# Patient Record
Sex: Male | Born: 1980 | Race: White | Hispanic: No | Smoking: Never smoker
Health system: Southern US, Community
[De-identification: ages and names within clinical notes are randomized; demographics above are authoritative.]

## PROBLEM LIST (undated history)

## (undated) ENCOUNTER — Emergency Department (HOSPITAL_COMMUNITY): Admission: EM | Payer: BC Managed Care – PPO | Source: Home / Self Care

## (undated) DIAGNOSIS — J302 Other seasonal allergic rhinitis: Secondary | ICD-10-CM

## (undated) DIAGNOSIS — F329 Major depressive disorder, single episode, unspecified: Secondary | ICD-10-CM

## (undated) DIAGNOSIS — M5136 Other intervertebral disc degeneration, lumbar region: Secondary | ICD-10-CM

## (undated) DIAGNOSIS — M542 Cervicalgia: Secondary | ICD-10-CM

## (undated) DIAGNOSIS — M51369 Other intervertebral disc degeneration, lumbar region without mention of lumbar back pain or lower extremity pain: Secondary | ICD-10-CM

## (undated) DIAGNOSIS — F32A Depression, unspecified: Secondary | ICD-10-CM

## (undated) DIAGNOSIS — F419 Anxiety disorder, unspecified: Secondary | ICD-10-CM

## (undated) HISTORY — DX: Other intervertebral disc degeneration, lumbar region without mention of lumbar back pain or lower extremity pain: M51.369

## (undated) HISTORY — DX: Other intervertebral disc degeneration, lumbar region: M51.36

## (undated) HISTORY — DX: Other seasonal allergic rhinitis: J30.2

## (undated) HISTORY — DX: Cervicalgia: M54.2

## (undated) HISTORY — DX: Major depressive disorder, single episode, unspecified: F32.9

## (undated) HISTORY — DX: Depression, unspecified: F32.A

---
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
## 2004-04-18 ENCOUNTER — Emergency Department (HOSPITAL_COMMUNITY): Admission: EM | Admit: 2004-04-18 | Discharge: 2004-04-18 | Payer: Self-pay | Admitting: *Deleted

## 2005-01-15 ENCOUNTER — Emergency Department (HOSPITAL_COMMUNITY): Admission: EM | Admit: 2005-01-15 | Discharge: 2005-01-15 | Payer: Self-pay | Admitting: Family Medicine

## 2006-07-31 ENCOUNTER — Observation Stay (HOSPITAL_COMMUNITY): Admission: EM | Admit: 2006-07-31 | Discharge: 2006-08-01 | Payer: Self-pay | Admitting: Emergency Medicine

## 2007-03-30 ENCOUNTER — Encounter: Admission: RE | Admit: 2007-03-30 | Discharge: 2007-03-30 | Payer: Self-pay | Admitting: Family Medicine

## 2007-04-18 ENCOUNTER — Encounter: Admission: RE | Admit: 2007-04-18 | Discharge: 2007-04-18 | Payer: Self-pay | Admitting: Family Medicine

## 2007-08-11 ENCOUNTER — Encounter: Admission: RE | Admit: 2007-08-11 | Discharge: 2007-08-11 | Payer: Self-pay | Admitting: Family Medicine

## 2007-10-11 ENCOUNTER — Encounter: Admission: RE | Admit: 2007-10-11 | Discharge: 2007-10-11 | Payer: Self-pay | Admitting: Orthopedic Surgery

## 2010-09-15 ENCOUNTER — Ambulatory Visit (HOSPITAL_COMMUNITY)
Admission: RE | Admit: 2010-09-15 | Discharge: 2010-09-15 | Disposition: A | Discharge status: Home / Self Care | Payer: BC Managed Care – PPO | Source: Ambulatory Visit | Attending: Chiropractic Medicine | Admitting: Chiropractic Medicine

## 2010-09-15 ENCOUNTER — Other Ambulatory Visit (HOSPITAL_COMMUNITY): Payer: Self-pay | Admitting: Chiropractic Medicine

## 2010-09-15 DIAGNOSIS — R52 Pain, unspecified: Secondary | ICD-10-CM

## 2010-09-15 DIAGNOSIS — M25559 Pain in unspecified hip: Secondary | ICD-10-CM | POA: Insufficient documentation

## 2010-09-15 DIAGNOSIS — M549 Dorsalgia, unspecified: Secondary | ICD-10-CM | POA: Insufficient documentation

## 2010-09-28 ENCOUNTER — Inpatient Hospital Stay (HOSPITAL_COMMUNITY)
Admission: EM | Admit: 2010-09-28 | Discharge: 2010-10-04 | DRG: 533 | Disposition: A | Discharge status: Home / Self Care | Payer: BLUE CROSS/BLUE SHIELD | Attending: Surgery | Admitting: Surgery

## 2010-09-28 ENCOUNTER — Emergency Department (HOSPITAL_COMMUNITY): Payer: BLUE CROSS/BLUE SHIELD

## 2010-09-28 ENCOUNTER — Encounter (HOSPITAL_COMMUNITY): Payer: Self-pay | Admitting: Radiology

## 2010-09-28 DIAGNOSIS — F1010 Alcohol abuse, uncomplicated: Secondary | ICD-10-CM | POA: Diagnosis present

## 2010-09-28 DIAGNOSIS — S27329A Contusion of lung, unspecified, initial encounter: Secondary | ICD-10-CM | POA: Diagnosis present

## 2010-09-28 DIAGNOSIS — J90 Pleural effusion, not elsewhere classified: Secondary | ICD-10-CM | POA: Diagnosis not present

## 2010-09-28 DIAGNOSIS — S2249XA Multiple fractures of ribs, unspecified side, initial encounter for closed fracture: Secondary | ICD-10-CM | POA: Diagnosis present

## 2010-09-28 DIAGNOSIS — D62 Acute posthemorrhagic anemia: Secondary | ICD-10-CM | POA: Diagnosis present

## 2010-09-28 DIAGNOSIS — S060X9A Concussion with loss of consciousness of unspecified duration, initial encounter: Principal | ICD-10-CM | POA: Diagnosis present

## 2010-09-28 DIAGNOSIS — S22009A Unspecified fracture of unspecified thoracic vertebra, initial encounter for closed fracture: Secondary | ICD-10-CM | POA: Diagnosis present

## 2010-09-28 DIAGNOSIS — R51 Headache: Secondary | ICD-10-CM | POA: Diagnosis present

## 2010-09-28 DIAGNOSIS — S0100XA Unspecified open wound of scalp, initial encounter: Secondary | ICD-10-CM | POA: Diagnosis present

## 2010-09-28 DIAGNOSIS — Y998 Other external cause status: Secondary | ICD-10-CM

## 2010-09-28 DIAGNOSIS — Y92410 Unspecified street and highway as the place of occurrence of the external cause: Secondary | ICD-10-CM

## 2010-09-28 DIAGNOSIS — S271XXA Traumatic hemothorax, initial encounter: Secondary | ICD-10-CM | POA: Diagnosis present

## 2010-09-28 DIAGNOSIS — T797XXA Traumatic subcutaneous emphysema, initial encounter: Secondary | ICD-10-CM | POA: Diagnosis present

## 2010-09-28 LAB — POCT I-STAT, CHEM 8
Calcium, Ion: 1.03 mmol/L — ABNORMAL LOW (ref 1.12–1.32)
Glucose, Bld: 118 mg/dL — ABNORMAL HIGH (ref 70–99)
HCT: 42 % (ref 39.0–52.0)
Hemoglobin: 14.3 g/dL (ref 13.0–17.0)
TCO2: 20 mmol/L (ref 0–100)

## 2010-09-28 LAB — TYPE AND SCREEN
ABO/RH(D): A NEG
Antibody Screen: NEGATIVE
Unit division: 0

## 2010-09-28 LAB — COMPREHENSIVE METABOLIC PANEL
Alkaline Phosphatase: 30 U/L — ABNORMAL LOW (ref 39–117)
BUN: 12 mg/dL (ref 6–23)
Glucose, Bld: 116 mg/dL — ABNORMAL HIGH (ref 70–99)
Potassium: 3.7 mEq/L (ref 3.5–5.1)
Total Protein: 6.5 g/dL (ref 6.0–8.3)

## 2010-09-28 LAB — ETHANOL: Alcohol, Ethyl (B): 164 mg/dL — ABNORMAL HIGH (ref 0–10)

## 2010-09-28 LAB — CBC
HCT: 38.8 % — ABNORMAL LOW (ref 39.0–52.0)
MCHC: 34.5 g/dL (ref 30.0–36.0)
RDW: 12.3 % (ref 11.5–15.5)

## 2010-09-28 LAB — PROTIME-INR
INR: 0.98 (ref 0.00–1.49)
Prothrombin Time: 13.2 seconds (ref 11.6–15.2)

## 2010-09-28 LAB — ABO/RH: ABO/RH(D): A NEG

## 2010-09-28 MED ORDER — IOHEXOL 300 MG/ML  SOLN
100.0000 mL | Freq: Once | INTRAMUSCULAR | Status: AC | PRN
  Administered 2010-09-28: 100 mL via INTRAVENOUS

## 2010-09-29 ENCOUNTER — Inpatient Hospital Stay (HOSPITAL_COMMUNITY): Payer: BLUE CROSS/BLUE SHIELD

## 2010-09-29 LAB — CBC
MCH: 30.5 pg (ref 26.0–34.0)
MCHC: 33.8 g/dL (ref 30.0–36.0)
Platelets: 264 10*3/uL (ref 150–400)
RBC: 4.06 MIL/uL — ABNORMAL LOW (ref 4.22–5.81)
RDW: 12.5 % (ref 11.5–15.5)

## 2010-09-30 LAB — CBC
Platelets: 223 10*3/uL (ref 150–400)
RBC: 3.55 MIL/uL — ABNORMAL LOW (ref 4.22–5.81)
WBC: 8.9 10*3/uL (ref 4.0–10.5)

## 2010-09-30 LAB — DIFFERENTIAL
Basophils Absolute: 0 10*3/uL (ref 0.0–0.1)
Basophils Relative: 0 % (ref 0–1)
Eosinophils Absolute: 0 10*3/uL (ref 0.0–0.7)
Lymphs Abs: 2 10*3/uL (ref 0.7–4.0)
Neutrophils Relative %: 66 % (ref 43–77)

## 2010-10-02 ENCOUNTER — Inpatient Hospital Stay (HOSPITAL_COMMUNITY): Payer: BLUE CROSS/BLUE SHIELD

## 2010-10-02 LAB — BASIC METABOLIC PANEL
Chloride: 101 mEq/L (ref 96–112)
GFR calc Af Amer: >60 mL/min (ref >60)
GFR calc non Af Amer: >60 mL/min (ref >60)
Potassium: 3.9 mEq/L (ref 3.5–5.1)
Sodium: 135 mEq/L (ref 135–145)

## 2010-10-03 ENCOUNTER — Inpatient Hospital Stay (HOSPITAL_COMMUNITY): Payer: BLUE CROSS/BLUE SHIELD

## 2010-10-04 ENCOUNTER — Inpatient Hospital Stay (HOSPITAL_COMMUNITY): Payer: BLUE CROSS/BLUE SHIELD

## 2010-10-04 LAB — CBC
HCT: 33.1 % — ABNORMAL LOW (ref 39.0–52.0)
MCHC: 34.1 g/dL (ref 30.0–36.0)
Platelets: 325 10*3/uL (ref 150–400)
RDW: 12.2 % (ref 11.5–15.5)
WBC: 9.7 10*3/uL (ref 4.0–10.5)
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
## 2010-10-09 ENCOUNTER — Ambulatory Visit (HOSPITAL_COMMUNITY)
Admission: RE | Admit: 2010-10-09 | Discharge: 2010-10-09 | Disposition: A | Discharge status: Home / Self Care | Payer: BLUE CROSS/BLUE SHIELD | Source: Ambulatory Visit | Attending: Physician Assistant | Admitting: Physician Assistant

## 2010-10-09 ENCOUNTER — Other Ambulatory Visit (HOSPITAL_COMMUNITY): Payer: Self-pay | Admitting: Physician Assistant

## 2010-10-09 DIAGNOSIS — X58XXXA Exposure to other specified factors, initial encounter: Secondary | ICD-10-CM | POA: Insufficient documentation

## 2010-10-09 DIAGNOSIS — J942 Hemothorax: Secondary | ICD-10-CM

## 2010-10-09 DIAGNOSIS — S2249XA Multiple fractures of ribs, unspecified side, initial encounter for closed fracture: Secondary | ICD-10-CM | POA: Insufficient documentation

## 2010-10-09 DIAGNOSIS — R079 Chest pain, unspecified: Secondary | ICD-10-CM | POA: Insufficient documentation
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
## 2010-10-31 NOTE — H&P (Signed)
NAME:  Michael Powers, Michael Powers NO.:  [id number]   MEDICAL RECORD NO.:  [id number]          PATIENT TYPE:  EMS   LOCATION:  ED                           FACILITY:  [facility]   PHYSICIAN:  Lonia Blood, M.D.       DATE OF BIRTH:  [birth year]   DATE OF ADMISSION:  07/31/2006  DATE OF DISCHARGE:                              HISTORY & PHYSICAL   PRIMARY CARE PHYSICIAN:  This patient does not have a primary care  physician.   CHIEF COMPLAINT:  Headache.   HISTORY OF PRESENT ILLNESS:  Michael Powers is a 30 year old gentleman  without any significant past medical history who was taken to an urgent  care today by his Mom, after he had a night of fever, chills, and severe  headache.  The patient reports that he got abruptly sick overnight and  he did not have any symptoms to suggest the problems.  From the urgent  care, he was brought to the emergency room where he had an emergent  lumbar puncture and was told that he is going to be admitted for  meningitis.  Now I am evaluating the patient and he is basically lying  on the stretcher.  He is very slow to respond.  He complains bitterly of  a headache as well as severe neck pain.  He also reports that he has got  a queasy feeling in his abdomen.  He says that he has never been sick  and this is a very frightening experience to him.  The patient also  reports a nagging cough that has been persistent now ever since he got  sick.   PAST MEDICAL HISTORY:  1. Anxiety.  2. Sinus problems.   SOCIAL HISTORY:  The patient lives alone.  He has a high school  education.  He does smoke cigarettes.  He does drink alcohol.  He owns a  Actor.   HE HAS NO KNOWN DRUG ALLERGIES.   He does not take any medications.   FAMILY HISTORY:  Noncontributory.   REVIEW OF SYSTEMS:  As per HPI.  Negative for shortness of breath.  Negative for chest pain.  Negative for nausea, vomiting, or abdominal  pain.   PHYSICAL EXAMINATION:  VITAL SIGNS:   On admission show a temperature of  100.7, pulse 100, respirations 18, blood pressure 120/72.  GENERAL APPEARANCE:  A lethargic patient but alert.  He seems stuporous  and very slow to respond.  His responses are somehow appropriate but it  appears that the patient has to think a very long time to produce an  answer.  EYES:  Have pupils equal and round, reactive to light and accommodation.  Extraocular movements intact.  NECK:  There is no objective nuchal rigidity but there is a lot of  resistance upon the mobilization of the neck.  CHEST:  Clear to auscultation bilaterally without wheezing, rhonchi,  crackles.  HEART:  Regular rate and rhythm with no murmurs, rubs, or gallops.  ABDOMEN:  Soft, nontender.  Bowel sounds are present.  LOWER EXTREMITIES:  No edema.  SKIN:  Warm and dry without any suspicious rashes.  NEUROLOGIC:  Nonfocal.  Cranial nerves III-XII are intact.  Strength 5/5  in all four extremities.   LABORATORY VALUES:  At the time of admission, white blood cell count is  5.8, hemoglobin 13.3, platelet count 229.  Sodium 135, potassium 3.4,  chloride 105, bicarb 22, BUN 11, creatinine 0.9, glucose 170.  Urinalysis is clear.  Lumbar puncture was done and the glucose, protein,  and cell count are within normal limits.  Influenza nasal swab is  positive for influenza A and B.   ASSESSMENT/PLAN:  Severe influenza with the possibility of superimposed  bacterial infection given the patient's toxic status.  I will admit the  patient for observation, place him on Tamiflu, and empiric doxycycline.  Also, we will give the patient intravenous normal saline and monitor his  mentation status closely.      Lonia Blood, M.D.  Electronically Signed     SL/MEDQ  D:  07/31/2006  T:  07/31/2006  Job:  [id number]

## 2010-10-31 NOTE — Discharge Summary (Signed)
NAME:  Michael Powers, Michael Powers NO.:  [id number]   MEDICAL RECORD NO.:  [id number]          PATIENT TYPE:  OBV   LOCATION:  1616                         [facility]ACILITY:  [facility]   PHYSICIAN:  Lonia Blood, M.D.       DATE OF BIRTH:  1980/12/03   DATE OF ADMISSION:  07/31/2006  DATE OF DISCHARGE:  08/01/2006                               DISCHARGE SUMMARY   DISCHARGE DIAGNOSES:  1. Influenza with probable interstitial pneumonitis.  2. Meningismus with severe headache.  Lumbar puncture negative for      meningitis.  3. Anxiety.  4. Chronic sinus problems.   DISCHARGE MEDICATIONS:  1. Tamiflu 75 mg BID for 4 days.  2. Tylenol 500 mg TId for 1 week  3. Ibuprofen 400 mg TID for 1 week  4. Tussionex 5 cc po BID   CONDITION ON DISCHARGE:  Patient was discharged in good condition.  At  the time of discharge, his fever has subsided.  He was able to tolerate  a regular diet, and he was not having anymore headaches.  The patient  was instructed to follow up with his primary care physician at [facility] urgent care center.   PROCEDURES DURING THIS ADMISSION:  1. On July 31, 2006, the patient underwent computerized      tomographic scan of the head with findings of no evidence of acute      intracranial abnormality.  2. On July 31, 2006, lumbar puncture per the emergency room      physician.  3. On August 01, 2006, chest x-ray PA and lateral with findings of      peribronchial thickening without focal air space disease.   CONSULTATIONS:  No consultations were obtained.   HISTORY AND PHYSICAL:  For admission history and physical, refer to the  dictated H&P done by Dr. Lavera Guise on July 31, 2006.   HOSPITAL COURSE:  Influenza:  Mr. Bischoff was brought to the emergency  room by his family because of a prostrate state, inability to eat,  severe headaches, and high fever.  In the emergency room, he was  mistakenly diagnosed with meningitis, and he received a lumbar puncture.  Nasal swabs were positive for influenza A and B.  The patient was started promptly on antipyretics as well as Tamiflu.  He  had a course of rapid recovery.  His hospital course was complicated by  a mild post lumbar puncture headache, which responded well with  conservative treatment.  The patient was discharged in good condition to  follow up with his primary care physician.      Lonia Blood, M.D.  Electronically Signed     SL/MEDQ  D:  08/16/2006  T:  08/16/2006  Job:  [id number]

## 2011-07-12 ENCOUNTER — Emergency Department (HOSPITAL_COMMUNITY)
Admission: EM | Admit: 2011-07-12 | Discharge: 2011-07-12 | Disposition: A | Discharge status: Home / Self Care | Payer: BC Managed Care – PPO | Attending: Emergency Medicine | Admitting: Emergency Medicine

## 2011-07-12 ENCOUNTER — Encounter (HOSPITAL_COMMUNITY): Payer: Self-pay | Admitting: Emergency Medicine

## 2011-07-12 DIAGNOSIS — F419 Anxiety disorder, unspecified: Secondary | ICD-10-CM

## 2011-07-12 DIAGNOSIS — F411 Generalized anxiety disorder: Secondary | ICD-10-CM | POA: Insufficient documentation

## 2011-07-12 HISTORY — DX: Anxiety disorder, unspecified: F41.9

## 2011-07-12 NOTE — ED Notes (Signed)
Pt has had nausea.  Denies V/D.  Pt also c/o anxiety.  Pt has taken Clonazepam today with no relief of anxiety.
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
## 2011-07-12 NOTE — ED Notes (Signed)
Pt wanded by security. 

## 2011-07-12 NOTE — ED Notes (Addendum)
C/o anxiety that is not relieved with his normal medicine.  Pt states he had a large amount of etoh last night and that he possibly has a gluten allergy that is causing this.  Denies SI or HI.

## 2011-07-12 NOTE — ED Notes (Signed)
Pt st's he went to a concert last pm and had a lot of ETOH to drink.  Approx 20 beers and other alcohol.  Pt c/o nausea and st's he has anxiety and feels very anxious.  Pt denies SI or HI

## 2012-08-29 ENCOUNTER — Encounter: Payer: Self-pay | Admitting: *Deleted

## 2012-08-30 ENCOUNTER — Encounter: Payer: Self-pay | Admitting: Family Medicine

## 2012-08-30 ENCOUNTER — Ambulatory Visit (INDEPENDENT_AMBULATORY_CARE_PROVIDER_SITE_OTHER): Payer: BC Managed Care – PPO | Admitting: Family Medicine

## 2012-08-30 VITALS — BP 120/90 | HR 72 | Temp 97.6°F | Resp 16 | Ht 70.0 in | Wt 148.0 lb

## 2012-08-30 DIAGNOSIS — F411 Generalized anxiety disorder: Secondary | ICD-10-CM

## 2012-08-30 DIAGNOSIS — R197 Diarrhea, unspecified: Secondary | ICD-10-CM

## 2012-08-30 LAB — BASIC METABOLIC PANEL
BUN: 13 mg/dL (ref 6–23)
CO2: 27 mEq/L (ref 19–32)
Calcium: 9.4 mg/dL (ref 8.4–10.5)
Chloride: 104 mEq/L (ref 96–112)
Creat: 0.94 mg/dL (ref 0.50–1.35)
Glucose, Bld: 87 mg/dL (ref 70–99)
Potassium: 4.9 mEq/L (ref 3.5–5.3)
Sodium: 140 mEq/L (ref 135–145)

## 2012-08-30 LAB — HEPATIC FUNCTION PANEL
ALT: 21 U/L (ref 0–53)
AST: 22 U/L (ref 0–37)
Albumin: 4.3 g/dL (ref 3.5–5.2)
Alkaline Phosphatase: 37 U/L — ABNORMAL LOW (ref 39–117)
Bilirubin, Direct: 0.1 mg/dL (ref 0.0–0.3)
Indirect Bilirubin: 0.3 mg/dL (ref 0.0–0.9)
Total Bilirubin: 0.4 mg/dL (ref 0.3–1.2)
Total Protein: 6.5 g/dL (ref 6.0–8.3)

## 2012-08-30 NOTE — Patient Instructions (Addendum)
Diarrhea Infections caused by germs (bacterial) or a virus commonly cause diarrhea. Your caregiver has determined that with time, rest and fluids, the diarrhea should improve. In general, eat normally while drinking more water than usual. Although water may prevent dehydration, it does not contain salt and minerals (electrolytes). Broths, weak tea without caffeine and oral rehydration solutions (ORS) replace fluids and electrolytes. Small amounts of fluids should be taken frequently. Large amounts at one time may not be tolerated. Plain water may be harmful in infants and the elderly. Oral rehydrating solutions (ORS) are available at pharmacies and grocery stores. ORS replace water and important electrolytes in proper proportions. Sports drinks are not as effective as ORS and may be harmful due to sugars worsening diarrhea.  ORS is especially recommended for use in children with diarrhea. As a general guideline for children, replace any new fluid losses from diarrhea and/or vomiting with ORS as follows:  If your child weighs 22 pounds or under (10 kg or less), give 60-120 mL ( -  cup or 2 - 4 ounces) of ORS for each episode of diarrheal stool or vomiting episode.  If your child weighs more than 22 pounds (more than 10 kgs), give 120-240 mL ( - 1 cup or 4 - 8 ounces) of ORS for each diarrheal stool or episode of vomiting.  While correcting for dehydration, children should eat normally. However, foods high in sugar should be avoided because this may worsen diarrhea. Large amounts of carbonated soft drinks, juice, gelatin desserts and other highly sugared drinks should be avoided.  After correction of dehydration, other liquids that are appealing to the child may be added. Children should drink small amounts of fluids frequently and fluids should be increased as tolerated. Children should drink enough fluids to keep urine clear or pale yellow.  Adults should eat normally while drinking more fluids than  usual. Drink small amounts of fluids frequently and increase as tolerated. Drink enough fluids to keep urine clear or pale yellow. Broths, weak decaffeinated tea, lemon lime soft drinks (allowed to go flat) and ORS replace fluids and electrolytes.  Avoid:  Carbonated drinks.  Juice.  Extremely hot or cold fluids.  Caffeine drinks.  Fatty, greasy foods.  Alcohol.  Tobacco.  Too much intake of anything at one time.  Gelatin desserts.  Probiotics are active cultures of beneficial bacteria. They may lessen the amount and number of diarrheal stools in adults. Probiotics can be found in yogurt with active cultures and in supplements.  Wash hands well to avoid spreading bacteria and virus.  Anti-diarrheal medications are not recommended for infants and children.  Only take over-the-counter or prescription medicines for pain, discomfort or fever as directed by your caregiver. Do not give aspirin to children because it may cause Reye's Syndrome.  For adults, ask your caregiver if you should continue all prescribed and over-the-counter medicines.  If your caregiver has given you a follow-up appointment, it is very important to keep that appointment. Not keeping the appointment could result in a chronic or permanent injury, and disability. If there is any problem keeping the appointment, you must call back to this facility for assistance. SEEK IMMEDIATE MEDICAL CARE IF:   You or your child is unable to keep fluids down or other symptoms or problems become worse in spite of treatment.  Vomiting or diarrhea develops and becomes persistent.  There is vomiting of blood or bile (green material).  There is blood in the stool or the stools are black and   tarry.  There is no urine output in 6-8 hours or there is only a small amount of very dark urine.  Abdominal pain develops, increases or localizes.  You have a fever.  Your baby is older than 3 months with a rectal temperature of 102 F  (38.9 C) or higher.  Your baby is 3 months old or younger with a rectal temperature of 100.4 F (38 C) or higher.  You or your child develops excessive weakness, dizziness, fainting or extreme thirst.  You or your child develops a rash, stiff neck, severe headache or become irritable or sleepy and difficult to awaken. MAKE SURE YOU:   Understand these instructions.  Will watch your condition.  Will get help right away if you are not doing well or get worse. Document Released: 05/22/2002 Document Revised: 08/24/2011 Document Reviewed: 04/08/2009 [employer] Patient Information 2013 [employer].  

## 2012-08-30 NOTE — Progress Notes (Signed)
Subjective:     Patient ID: ROC STREETT, male   DOB: 07-Nov-1980, 32 y.o.   MRN: [id number]  HPI Symptoms began in February. reports sudden onset of watery diarrhea.  Symptoms persisted for 2 days. Patient went to urgent care. There he was placed on Cipro and Lomotil. After 4 days the diarrhea improved. He was fine until March 13. Again sudden onset of watery diarrhea began. He is now having 4 days of low-grade fevers, watery diarrhea, and crampy abdominal pain. He denies any recent travel, sick contacts, no hospitalizations. He denies high-grade fever or hematochezia.  Review of Systems  Constitutional: Positive for fever and fatigue.  HENT: Negative for congestion, rhinorrhea and postnasal drip.   Respiratory: Negative for cough.   Gastrointestinal: Negative for abdominal pain, blood in stool, abdominal distention and anal bleeding.       Objective:   Physical Exam  Constitutional: He appears well-developed and well-nourished. No distress.  HENT:  Head: Normocephalic.  Right Ear: External ear normal.  Left Ear: External ear normal.  Mouth/Throat: Oropharynx is clear and moist.  Eyes: Conjunctivae are normal. Pupils are equal, round, and reactive to light.  Neck: Normal range of motion. Neck supple.  Cardiovascular: Normal rate, regular rhythm and normal heart sounds.   Pulmonary/Chest: Effort normal and breath sounds normal.  Abdominal: Soft. Bowel sounds are normal. He exhibits no distension and no mass. There is no tenderness. There is no rebound and no guarding.  Lymphadenopathy:    He has no cervical adenopathy.  Skin: He is not diaphoretic.       Assessment:    diarrhea Generalized anxiety disorder    Plan:    I. suspect viral gastroenteritis I think this is likely a second infection that was just a coincidence I have advised the patient to use Imodium as directed on the bottle. I have also advised him to push fluids such as Gatorade. I will check stool cultures and stool  O&P. Will also check a CBC and CMP

## 2012-08-30 NOTE — Addendum Note (Signed)
Addended by: WRAY, [place] on: 08/30/2012 12:09 PM   Modules accepted: Orders

## 2012-08-31 ENCOUNTER — Other Ambulatory Visit (INDEPENDENT_AMBULATORY_CARE_PROVIDER_SITE_OTHER): Payer: BC Managed Care – PPO

## 2012-08-31 DIAGNOSIS — R197 Diarrhea, unspecified: Secondary | ICD-10-CM

## 2012-08-31 LAB — CBC WITH DIFFERENTIAL/PLATELET
Basophils Absolute: 0 10*3/uL (ref 0.0–0.1)
Basophils Relative: 1 % (ref 0–1)
Eosinophils Absolute: 0.1 10*3/uL (ref 0.0–0.7)
Eosinophils Relative: 3 % (ref 0–5)
HCT: 43.9 % (ref 39.0–52.0)
Hemoglobin: 14.9 g/dL (ref 13.0–17.0)
Lymphocytes Relative: 45 % (ref 12–46)
Lymphs Abs: 2 10*3/uL (ref 0.7–4.0)
MCH: 29.6 pg (ref 26.0–34.0)
MCHC: 33.9 g/dL (ref 30.0–36.0)
MCV: 87.3 fL (ref 78.0–100.0)
Monocytes Absolute: 0.8 10*3/uL (ref 0.1–1.0)
Monocytes Relative: 18 % — ABNORMAL HIGH (ref 3–12)
Neutro Abs: 1.5 10*3/uL — ABNORMAL LOW (ref 1.7–7.7)
Neutrophils Relative %: 33 % — ABNORMAL LOW (ref 43–77)
Platelets: 256 10*3/uL (ref 150–400)
RBC: 5.03 MIL/uL (ref 4.22–5.81)
RDW: 13.7 % (ref 11.5–15.5)
WBC: 4.4 10*3/uL (ref 4.0–10.5)

## 2012-08-31 NOTE — Addendum Note (Signed)
Addended by: WRAY, [place] on: 08/31/2012 08:56 AM   Modules accepted: Orders

## 2012-08-31 NOTE — Addendum Note (Signed)
Addended by: WRAY, [place] on: 08/31/2012 12:21 PM   Modules accepted: Orders

## 2012-08-31 NOTE — Addendum Note (Signed)
Addended by: WRAY, [place] on: 08/31/2012 12:14 PM   Modules accepted: Orders

## 2012-09-01 LAB — CLOSTRIDIUM DIFFICILE EIA: CDIFTX: NEGATIVE

## 2012-09-02 LAB — OVA AND PARASITE EXAMINATION: OP: NONE SEEN

## 2012-09-04 LAB — STOOL CULTURE

## 2013-01-13 ENCOUNTER — Telehealth: Payer: Self-pay | Admitting: Family Medicine

## 2013-01-13 NOTE — Telephone Encounter (Signed)
Ok to refill 

## 2013-01-13 NOTE — Telephone Encounter (Signed)
?   OK to Refill  

## 2013-01-16 MED ORDER — CLONAZEPAM 0.5 MG PO TABS: 0.1250 mg | ORAL_TABLET | Freq: Two times a day (BID) | ORAL | Status: DC | PRN

## 2013-01-16 NOTE — Telephone Encounter (Signed)
Rx Refilled  

## 2013-01-22 ENCOUNTER — Emergency Department (HOSPITAL_COMMUNITY)
Admission: EM | Admit: 2013-01-22 | Discharge: 2013-01-22 | Disposition: A | Discharge status: Home / Self Care | Payer: BC Managed Care – PPO | Attending: Emergency Medicine | Admitting: Emergency Medicine

## 2013-01-22 ENCOUNTER — Emergency Department (HOSPITAL_COMMUNITY): Payer: BC Managed Care – PPO

## 2013-01-22 ENCOUNTER — Encounter (HOSPITAL_COMMUNITY): Payer: Self-pay | Admitting: *Deleted

## 2013-01-22 DIAGNOSIS — R109 Unspecified abdominal pain: Secondary | ICD-10-CM

## 2013-01-22 DIAGNOSIS — R110 Nausea: Secondary | ICD-10-CM | POA: Insufficient documentation

## 2013-01-22 DIAGNOSIS — R1011 Right upper quadrant pain: Secondary | ICD-10-CM | POA: Insufficient documentation

## 2013-01-22 DIAGNOSIS — Z8739 Personal history of other diseases of the musculoskeletal system and connective tissue: Secondary | ICD-10-CM | POA: Insufficient documentation

## 2013-01-22 DIAGNOSIS — K219 Gastro-esophageal reflux disease without esophagitis: Secondary | ICD-10-CM | POA: Insufficient documentation

## 2013-01-22 DIAGNOSIS — F329 Major depressive disorder, single episode, unspecified: Secondary | ICD-10-CM | POA: Insufficient documentation

## 2013-01-22 DIAGNOSIS — Z8709 Personal history of other diseases of the respiratory system: Secondary | ICD-10-CM | POA: Insufficient documentation

## 2013-01-22 DIAGNOSIS — F3289 Other specified depressive episodes: Secondary | ICD-10-CM | POA: Insufficient documentation

## 2013-01-22 DIAGNOSIS — R079 Chest pain, unspecified: Secondary | ICD-10-CM | POA: Insufficient documentation

## 2013-01-22 DIAGNOSIS — F410 Panic disorder [episodic paroxysmal anxiety] without agoraphobia: Secondary | ICD-10-CM | POA: Insufficient documentation

## 2013-01-22 LAB — CBC WITH DIFFERENTIAL/PLATELET
Basophils Absolute: 0 10*3/uL (ref 0.0–0.1)
Basophils Relative: 1 % (ref 0–1)
HCT: 42.6 % (ref 39.0–52.0)
Hemoglobin: 14.9 g/dL (ref 13.0–17.0)
Lymphocytes Relative: 23 % (ref 12–46)
MCHC: 35 g/dL (ref 30.0–36.0)
Neutro Abs: 5.6 10*3/uL (ref 1.7–7.7)
Neutrophils Relative %: 68 % (ref 43–77)
RDW: 12 % (ref 11.5–15.5)
WBC: 8.3 10*3/uL (ref 4.0–10.5)

## 2013-01-22 LAB — COMPREHENSIVE METABOLIC PANEL
Albumin: 3.8 g/dL (ref 3.5–5.2)
Alkaline Phosphatase: 47 U/L (ref 39–117)
BUN: 11 mg/dL (ref 6–23)
Creatinine, Ser: 0.88 mg/dL (ref 0.50–1.35)
GFR calc Af Amer: >90 mL/min (ref >90)
Glucose, Bld: 97 mg/dL (ref 70–99)
Potassium: 3.7 mEq/L (ref 3.5–5.1)
Total Bilirubin: 0.7 mg/dL (ref 0.3–1.2)
Total Protein: 6.8 g/dL (ref 6.0–8.3)

## 2013-01-22 LAB — LIPASE, BLOOD: Lipase: 22 U/L (ref 11–59)

## 2013-01-22 LAB — POCT I-STAT TROPONIN I: Troponin i, poc: 0 ng/mL (ref 0.00–0.08)

## 2013-01-22 MED ORDER — GI COCKTAIL ~~LOC~~
30.0000 mL | Freq: Once | ORAL | Status: AC
  Administered 2013-01-22: 30 mL via ORAL
  Filled 2013-01-22: qty 30

## 2013-01-22 MED ORDER — OMEPRAZOLE 20 MG PO CPDR: 40.0000 mg | DELAYED_RELEASE_CAPSULE | Freq: Every day | ORAL | Status: DC
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
## 2013-01-22 NOTE — ED Notes (Signed)
Admits to drinking a lot of alcohol the past 4 days and tequila.
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
## 2013-01-25 NOTE — ED Provider Notes (Signed)
Medical screening examination/treatment/procedure(s) were conducted as a shared visit with non-physician practitioner(s) and myself.  I personally evaluated the patient during the encounter.  Patient comes in with AMS. Pt is altered, but baseline per family when he relapses. CT heard is normal. No concerns for infection.  Toxic/alcoholis encephalopathy. No seizures appreciated. Will help out with dispo to detox.  Derwood Kaplan, MD 01/25/13 1227

## 2013-02-27 ENCOUNTER — Ambulatory Visit (INDEPENDENT_AMBULATORY_CARE_PROVIDER_SITE_OTHER): Payer: BC Managed Care – PPO | Admitting: Physician Assistant

## 2013-02-27 ENCOUNTER — Encounter: Payer: Self-pay | Admitting: Physician Assistant

## 2013-02-27 VITALS — BP 110/74 | HR 68 | Temp 98.1°F | Resp 18 | Ht 69.0 in | Wt 145.0 lb

## 2013-02-27 DIAGNOSIS — M79609 Pain in unspecified limb: Secondary | ICD-10-CM

## 2013-02-27 DIAGNOSIS — M79631 Pain in right forearm: Secondary | ICD-10-CM

## 2013-02-27 MED ORDER — CYCLOBENZAPRINE HCL 10 MG PO TABS: 10.0000 mg | ORAL_TABLET | Freq: Three times a day (TID) | ORAL | Status: DC | PRN

## 2013-02-27 MED ORDER — PREDNISONE 20 MG PO TABS: ORAL_TABLET | ORAL | Status: DC

## 2013-02-27 NOTE — Progress Notes (Signed)
Patient ID: Michael Powers MRN: [id number], DOB: [birth year], 32 y.o. Date of Encounter: 02/27/2013, 4:57 PM    Chief Complaint:  Chief Complaint  Patient presents with  . c/o right forearm pain    no known injury, tender spot on elbow     HPI: 32 y.o. year old white male present as for evaluation of pain in his right forearm. He says about one week ago he noticed that there was one area on the right elbow that if he applied pressure to that one spot to a positive shooting pain down the arm. For the past 3 days he has been noticing pain in the forearm itself. There was no acute onset of pain. He said he noticed it over the weekend when he was picking up his daughter as well as with other activities. He had no acute injury or trauma. He works doing Aeronautical engineer. Says that he is extremely busy last week doing aerating, seeding, putting out fertilizer, seeds, and straw.  Home Meds: See attached medication section for any medications that were entered at today's visit. The computer does not put those onto this list.The following list is a list of meds entered prior to today's visit.   Current Outpatient Prescriptions on File Prior to Visit  Medication Sig Dispense Refill  . clonazePAM (KLONOPIN) 0.5 MG tablet Take 0.25 mg by mouth 2 (two) times daily as needed for anxiety.      Marland Kitchen omeprazole (PRILOSEC) 20 MG capsule Take 2 capsules (40 mg total) by mouth daily.  30 capsule  0   No current facility-administered medications on file prior to visit.    Allergies:  Allergies  Allergen Reactions  . Bee Venom       Review of Systems: See HPI for pertinent ROS. All other ROS negative.    Physical Exam: Blood pressure 110/74, pulse 68, temperature 98.1 F (36.7 C), temperature source Oral, resp. rate 18, height 5\' 9"  (1.753 m), weight 145 lb (65.772 kg)., Body mass index is 21.4 kg/(m^2). General: Well-nourished well-developed white male. Appears in no acute distress. Lungs: Clear bilaterally  to auscultation without wheezes, rales, or rhonchi. Breathing is unlabored. Heart: Regular rhythm. No murmurs, rubs, or gallops. Msk:  Strength and tone normal for age. Right arm: Olecranon process is palpated. There is moderate tenderness with palpation here. There is no sign of olecranon bursitis. No erythema no swelling no protrusion. There is no pain with palpation of the medial epicondyles. There is mild tenderness with palpation of the lateral epicondyle. No severe tenderness with palpation of the muscle in the area just distal to the level of the elbow on the dorsal side. This muscle protrudes on both of his arms bilaterally that isn't protruding more so on the right than the left. This muscle is tender from just distal of the elbow all the way down to the level of the wrist. There is full range of motion of the wrist with minimal pain. 5 over 5 grip strength. Neuro: Alert and oriented X 3. Moves all extremities spontaneously. Gait is normal. CNII-XII grossly in tact. Psych:  Responds to questions appropriately with a normal affect.     ASSESSMENT AND PLAN:  32 y.o. year old male with  1. Right forearm pain Patient says that he is really concerned about his elbow and really would like an x-ray. We'll go ahead and get x-ray to rule out any type of mass.   The most of his symptoms are secondary to muscle strain  and inflammation around the nerve. Cautioned regarding possible drowsiness with Flexeril. This is the case just use this at night. If symptoms are not significantly improved or resolved by the completion of the prednisone then followup. He is to rest the arm as much as possible. Discussed that if he continues to do repetitive motion or any lifting or other movement against resistance that this is going to prolong the irritation and inflammation there. - DG Elbow Complete Right; Future - predniSONE (DELTASONE) 20 MG tablet; Take 3 daily for 2 days, then 2 daily for 2 days, then 1 daily  for 2 days.  Dispense: 12 tablet; Refill: 0 - cyclobenzaprine (FLEXERIL) 10 MG tablet; Take 1 tablet (10 mg total) by mouth 3 (three) times daily as needed for muscle spasms.  Dispense: 30 tablet; Refill: 0   Signed, [address] [place], [place], [facility] 02/27/2013 4:57 PM

## 2013-03-30 ENCOUNTER — Ambulatory Visit
Admission: RE | Admit: 2013-03-30 | Discharge: 2013-03-30 | Disposition: A | Discharge status: Home / Self Care | Payer: BC Managed Care – PPO | Source: Ambulatory Visit | Attending: Physician Assistant | Admitting: Physician Assistant

## 2013-03-30 DIAGNOSIS — M79631 Pain in right forearm: Secondary | ICD-10-CM

## 2013-10-02 ENCOUNTER — Encounter: Payer: Self-pay | Admitting: Family Medicine

## 2013-10-02 ENCOUNTER — Ambulatory Visit (INDEPENDENT_AMBULATORY_CARE_PROVIDER_SITE_OTHER): Payer: BC Managed Care – PPO | Admitting: Family Medicine

## 2013-10-02 VITALS — BP 110/60 | HR 60 | Temp 97.8°F | Resp 12 | Ht 70.5 in | Wt 144.0 lb

## 2013-10-02 DIAGNOSIS — J019 Acute sinusitis, unspecified: Secondary | ICD-10-CM

## 2013-10-02 MED ORDER — PREDNISONE 20 MG PO TABS: ORAL_TABLET | ORAL | Status: DC

## 2013-10-02 MED ORDER — FLUTICASONE PROPIONATE 50 MCG/ACT NA SUSP: 2.0000 | Freq: Every day | NASAL | Status: DC

## 2013-10-02 MED ORDER — CEFDINIR 300 MG PO CAPS: 300.0000 mg | ORAL_CAPSULE | Freq: Two times a day (BID) | ORAL | Status: DC
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
## 2014-04-04 ENCOUNTER — Emergency Department (HOSPITAL_COMMUNITY): Payer: BC Managed Care – PPO

## 2014-04-04 ENCOUNTER — Encounter (HOSPITAL_COMMUNITY): Payer: Self-pay | Admitting: Emergency Medicine

## 2014-04-04 ENCOUNTER — Emergency Department (HOSPITAL_COMMUNITY)
Admission: EM | Admit: 2014-04-04 | Discharge: 2014-04-04 | Disposition: A | Discharge status: Home / Self Care | Payer: BC Managed Care – PPO | Attending: Emergency Medicine | Admitting: Emergency Medicine

## 2014-04-04 DIAGNOSIS — Y990 Civilian activity done for income or pay: Secondary | ICD-10-CM | POA: Diagnosis not present

## 2014-04-04 DIAGNOSIS — Y9269 Other specified industrial and construction area as the place of occurrence of the external cause: Secondary | ICD-10-CM | POA: Insufficient documentation

## 2014-04-04 DIAGNOSIS — S0101XA Laceration without foreign body of scalp, initial encounter: Secondary | ICD-10-CM | POA: Insufficient documentation

## 2014-04-04 DIAGNOSIS — S060X1A Concussion with loss of consciousness of 30 minutes or less, initial encounter: Secondary | ICD-10-CM | POA: Diagnosis not present

## 2014-04-04 DIAGNOSIS — Z7951 Long term (current) use of inhaled steroids: Secondary | ICD-10-CM | POA: Diagnosis not present

## 2014-04-04 DIAGNOSIS — Z79899 Other long term (current) drug therapy: Secondary | ICD-10-CM | POA: Diagnosis not present

## 2014-04-04 DIAGNOSIS — Y93H2 Activity, gardening and landscaping: Secondary | ICD-10-CM | POA: Insufficient documentation

## 2014-04-04 DIAGNOSIS — Z23 Encounter for immunization: Secondary | ICD-10-CM | POA: Diagnosis not present

## 2014-04-04 DIAGNOSIS — W01198A Fall on same level from slipping, tripping and stumbling with subsequent striking against other object, initial encounter: Secondary | ICD-10-CM | POA: Diagnosis not present

## 2014-04-04 DIAGNOSIS — Z8739 Personal history of other diseases of the musculoskeletal system and connective tissue: Secondary | ICD-10-CM | POA: Insufficient documentation

## 2014-04-04 DIAGNOSIS — S161XXA Strain of muscle, fascia and tendon at neck level, initial encounter: Secondary | ICD-10-CM | POA: Diagnosis not present

## 2014-04-04 DIAGNOSIS — S0990XA Unspecified injury of head, initial encounter: Secondary | ICD-10-CM | POA: Diagnosis present

## 2014-04-04 DIAGNOSIS — Z8659 Personal history of other mental and behavioral disorders: Secondary | ICD-10-CM | POA: Insufficient documentation

## 2014-04-04 LAB — COMPREHENSIVE METABOLIC PANEL
ALBUMIN: 4.2 g/dL (ref 3.5–5.2)
ALT: 14 U/L (ref 0–53)
ANION GAP: 14 (ref 5–15)
AST: 18 U/L (ref 0–37)
Alkaline Phosphatase: 39 U/L (ref 39–117)
BILIRUBIN TOTAL: 0.6 mg/dL (ref 0.3–1.2)
BUN: 18 mg/dL (ref 6–23)
CHLORIDE: 104 meq/L (ref 96–112)
CO2: 24 mEq/L (ref 19–32)
CREATININE: 1.02 mg/dL (ref 0.50–1.35)
Calcium: 9.6 mg/dL (ref 8.4–10.5)
GFR calc non Af Amer: >90 mL/min (ref >90)
GLUCOSE: 111 mg/dL — AB (ref 70–99)
Potassium: 3.8 mEq/L (ref 3.7–5.3)
Sodium: 142 mEq/L (ref 137–147)
TOTAL PROTEIN: 6.9 g/dL (ref 6.0–8.3)

## 2014-04-04 LAB — PREPARE FRESH FROZEN PLASMA
UNIT DIVISION: 0
Unit division: 0

## 2014-04-04 LAB — BLOOD PRODUCT ORDER (VERBAL) VERIFICATION

## 2014-04-04 LAB — CBC
HEMATOCRIT: 42.3 % (ref 39.0–52.0)
Hemoglobin: 14.4 g/dL (ref 13.0–17.0)
MCH: 30.6 pg (ref 26.0–34.0)
MCHC: 34 g/dL (ref 30.0–36.0)
MCV: 89.8 fL (ref 78.0–100.0)
Platelets: 269 10*3/uL (ref 150–400)
RBC: 4.71 MIL/uL (ref 4.22–5.81)
RDW: 12.1 % (ref 11.5–15.5)
WBC: 7.8 10*3/uL (ref 4.0–10.5)

## 2014-04-04 LAB — TYPE AND SCREEN
ABO/RH(D): A NEG
Antibody Screen: NEGATIVE
UNIT DIVISION: 0
Unit division: 0

## 2014-04-04 LAB — PROTIME-INR
INR: 1.01 (ref 0.00–1.49)
PROTHROMBIN TIME: 13.4 s (ref 11.6–15.2)

## 2014-04-04 LAB — ETHANOL

## 2014-04-04 LAB — CDS SEROLOGY

## 2014-04-04 LAB — ABO/RH: ABO/RH(D): A NEG

## 2014-04-04 MED ORDER — SODIUM CHLORIDE 0.9 % IV SOLN
Freq: Once | INTRAVENOUS | Status: AC
  Administered 2014-04-04: 14:00:00 via INTRAVENOUS

## 2014-04-04 MED ORDER — OXYCODONE-ACETAMINOPHEN 5-325 MG PO TABS
1.0000 | ORAL_TABLET | Freq: Once | ORAL | Status: AC
  Administered 2014-04-04: 1 via ORAL
  Filled 2014-04-04: qty 1

## 2014-04-04 MED ORDER — LIDOCAINE HCL (PF) 1 % IJ SOLN
5.0000 mL | Freq: Once | INTRAMUSCULAR | Status: AC
  Administered 2014-04-04: 5 mL via INTRADERMAL
  Filled 2014-04-04: qty 5

## 2014-04-04 MED ORDER — TETANUS-DIPHTH-ACELL PERTUSSIS 5-2.5-18.5 LF-MCG/0.5 IM SUSP
0.5000 mL | Freq: Once | INTRAMUSCULAR | Status: AC
  Administered 2014-04-04: 0.5 mL via INTRAMUSCULAR
  Filled 2014-04-04: qty 0.5

## 2014-04-04 MED ORDER — SODIUM CHLORIDE 0.9 % IV SOLN
Freq: Once | INTRAVENOUS | Status: AC
  Administered 2014-04-04: 12:00:00 via INTRAVENOUS

## 2014-04-04 NOTE — ED Notes (Signed)
Pt sitting on side of stretcher and states he is feeling light headed again. MD aware and asked to feed pt and given IVF

## 2014-04-04 NOTE — ED Notes (Signed)
When pt sitting on side of stretcher, states he feels really dizzy.
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
## 2014-04-04 NOTE — ED Notes (Signed)
Family at beside. Family given emotional support. 

## 2014-04-04 NOTE — ED Notes (Signed)
PA at the bedside to staple laceration on head.
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
## 2014-04-04 NOTE — ED Notes (Signed)
Walked patient felt dizzy when he stood up but, patient walked fine

## 2014-04-04 NOTE — ED Provider Notes (Signed)
Medical screening examination/treatment/procedure(s) were conducted as a shared visit with non-physician practitioner(s) and myself.  I personally evaluated the patient during the encounter.   EKG Interpretation None         Joya Gaskinsonald W Makel Mcmann, MD 04/04/14 1352

## 2014-04-04 NOTE — ED Notes (Signed)
Pt got up and walked in room with two RNs at the bedside. Pt states he feels much better since he was able to eat something. Dr. Bebe ShaggyWickline made aware.

## 2014-04-04 NOTE — ED Notes (Signed)
Initial pressure in triage was 79 systolic. Pt arrived via POV
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
## 2014-04-04 NOTE — ED Notes (Signed)
Pt taken to CT scan.

## 2014-04-04 NOTE — ED Provider Notes (Signed)
12:32 PM Asked by Dr. Bebe ShaggyWickline to repair head laceration.  LACERATION REPAIR Performed by: Roxy HorsemanBROWNING, Kyliana Standen Authorized by: Roxy HorsemanBROWNING, Kerem Gilmer Consent: Verbal consent obtained. Risks and benefits: risks, benefits and alternatives were discussed Consent given by: patient Patient identity confirmed: provided demographic data Prepped and Draped in normal sterile fashion Wound explored  Laceration Location: top of head  Laceration Length: 3 cm  No Foreign Bodies seen or palpated  Anesthesia: local infiltration  Local anesthetic: lidocaine 1% without epinephrine  Anesthetic total: 3 ml  Irrigation method: syringe Amount of cleaning: standard  Skin closure: staples  Number of sutures: 3  Technique: staples  Patient tolerance: Patient tolerated the procedure well with no immediate complications.   Roxy Horsemanobert Avagail Whittlesey, PA-C 04/04/14 1306

## 2014-04-04 NOTE — ED Notes (Signed)
Dr. Bebe Shaggywickline back at the bedside to remove c-collar.

## 2014-04-06 ENCOUNTER — Telehealth: Payer: Self-pay | Admitting: *Deleted

## 2014-04-06 NOTE — Telephone Encounter (Signed)
lmtrc

## 2014-04-06 NOTE — Telephone Encounter (Signed)
Call patient and have him come in next week to discuss.
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
## 2014-04-09 NOTE — Telephone Encounter (Signed)
Pt aware need to come in this week to discuss concusson, appt made for Thurs Oct 29 at 12:15

## 2014-04-12 ENCOUNTER — Ambulatory Visit (INDEPENDENT_AMBULATORY_CARE_PROVIDER_SITE_OTHER): Payer: BC Managed Care – PPO | Admitting: Family Medicine

## 2014-04-12 ENCOUNTER — Encounter: Payer: Self-pay | Admitting: Family Medicine

## 2014-04-12 VITALS — BP 100/76 | HR 82 | Temp 98.2°F | Resp 12 | Ht 70.5 in | Wt 151.0 lb

## 2014-04-12 DIAGNOSIS — S060X1A Concussion with loss of consciousness of 30 minutes or less, initial encounter: Secondary | ICD-10-CM
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
## 2014-04-17 ENCOUNTER — Ambulatory Visit (INDEPENDENT_AMBULATORY_CARE_PROVIDER_SITE_OTHER): Payer: BC Managed Care – PPO | Admitting: Diagnostic Neuroimaging

## 2014-04-17 ENCOUNTER — Encounter: Payer: Self-pay | Admitting: Diagnostic Neuroimaging

## 2014-04-17 VITALS — Ht 70.0 in | Wt 149.4 lb

## 2014-04-17 DIAGNOSIS — F0781 Postconcussional syndrome: Secondary | ICD-10-CM

## 2014-04-17 DIAGNOSIS — F411 Generalized anxiety disorder: Secondary | ICD-10-CM

## 2014-04-17 DIAGNOSIS — G4700 Insomnia, unspecified: Secondary | ICD-10-CM

## 2014-04-17 MED ORDER — AMITRIPTYLINE HCL 25 MG PO TABS: 25.0000 mg | ORAL_TABLET | Freq: Every day | ORAL | Status: DC

## 2014-04-17 NOTE — Patient Instructions (Signed)
Try amitriptyline 25mg  at bedtime to help with sleep, anxiety and headaches.  Rest and gradually increase activities after you are feeling better.
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
## 2014-04-19 ENCOUNTER — Ambulatory Visit (INDEPENDENT_AMBULATORY_CARE_PROVIDER_SITE_OTHER): Payer: BC Managed Care – PPO | Admitting: Family Medicine

## 2014-04-19 ENCOUNTER — Telehealth: Payer: Self-pay | Admitting: Family Medicine

## 2014-04-19 ENCOUNTER — Encounter: Payer: Self-pay | Admitting: Family Medicine

## 2014-04-19 VITALS — BP 110/68 | HR 78 | Temp 97.8°F | Resp 16 | Ht 70.5 in | Wt 151.0 lb

## 2014-04-19 DIAGNOSIS — IMO0001 Reserved for inherently not codable concepts without codable children: Secondary | ICD-10-CM

## 2014-04-19 DIAGNOSIS — H8143 Vertigo of central origin, bilateral: Secondary | ICD-10-CM

## 2014-04-19 MED ORDER — MECLIZINE HCL 25 MG PO TABS: 25.0000 mg | ORAL_TABLET | Freq: Three times a day (TID) | ORAL | Status: DC

## 2014-04-19 MED ORDER — SCOPOLAMINE 1 MG/3DAYS TD PT72: 1.0000 | MEDICATED_PATCH | TRANSDERMAL | Status: DC

## 2014-04-19 MED ORDER — MECLIZINE HCL 25 MG PO TABS
25.0000 mg | ORAL_TABLET | Freq: Three times a day (TID) | ORAL | Status: DC
Start: 2014-04-19 — End: 2014-04-19

## 2014-04-19 NOTE — Addendum Note (Signed)
Addended by: Phillips OdorSIX, Elsie Baynes H on: 04/19/2014 03:28 PM   Modules accepted: Orders
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
## 2014-04-19 NOTE — Telephone Encounter (Signed)
Pt states that he did get one patch so I informed him to not take the meclizine for 3 days then the patch run out of medicine. He verbalizes understanding. Med sent to pharm

## 2014-04-19 NOTE — Telephone Encounter (Signed)
Patient calling to ask some questions about the patch we prescribed for him, said when he went to go pick it up, it was for 10 and it was 180.00, wants to know does this sound right?  Please call him back at [phone]

## 2014-04-19 NOTE — Addendum Note (Signed)
Addended by: Legrand RamsWILLIS, Carnie Bruemmer B on: 04/19/2014 03:43 PM   Modules accepted: Orders

## 2014-04-26 ENCOUNTER — Ambulatory Visit (INDEPENDENT_AMBULATORY_CARE_PROVIDER_SITE_OTHER): Payer: BC Managed Care – PPO | Admitting: Family Medicine

## 2014-04-26 ENCOUNTER — Encounter: Payer: Self-pay | Admitting: Family Medicine

## 2014-04-26 VITALS — BP 108/60 | HR 78 | Temp 98.0°F | Resp 14 | Ht 70.5 in | Wt 149.0 lb

## 2014-04-26 DIAGNOSIS — F0781 Postconcussional syndrome: Secondary | ICD-10-CM
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
## 2014-05-29 ENCOUNTER — Ambulatory Visit (INDEPENDENT_AMBULATORY_CARE_PROVIDER_SITE_OTHER): Payer: BC Managed Care – PPO | Admitting: Diagnostic Neuroimaging

## 2014-05-29 ENCOUNTER — Encounter: Payer: Self-pay | Admitting: Diagnostic Neuroimaging

## 2014-05-29 VITALS — BP 114/71 | HR 72 | Temp 97.0°F | Ht 70.0 in | Wt 148.8 lb

## 2014-05-29 DIAGNOSIS — F0781 Postconcussional syndrome: Secondary | ICD-10-CM

## 2014-05-29 NOTE — Patient Instructions (Signed)
Keep up the good work   Follow up as needed
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
## 2014-08-13 ENCOUNTER — Ambulatory Visit (INDEPENDENT_AMBULATORY_CARE_PROVIDER_SITE_OTHER): Payer: BLUE CROSS/BLUE SHIELD | Admitting: Family Medicine

## 2014-08-13 ENCOUNTER — Encounter: Payer: Self-pay | Admitting: Family Medicine

## 2014-08-13 VITALS — BP 110/64 | HR 76 | Temp 97.8°F | Resp 14 | Ht 70.5 in | Wt 149.0 lb

## 2014-08-13 DIAGNOSIS — S0100XS Unspecified open wound of scalp, sequela: Secondary | ICD-10-CM | POA: Diagnosis not present

## 2014-08-13 DIAGNOSIS — Z87438 Personal history of other diseases of male genital organs: Secondary | ICD-10-CM

## 2014-08-13 LAB — HIV ANTIBODY (ROUTINE TESTING W REFLEX): HIV 1&2 Ab, 4th Generation: NONREACTIVE
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
## 2014-08-14 LAB — RPR

## 2014-08-15 LAB — HSV(HERPES SMPLX)ABS-I+II(IGG+IGM)-BLD
HSV 1 Glycoprotein G Ab, IgG: 9.23 IV — ABNORMAL HIGH
HSV 2 Glycoprotein G Ab, IgG: 0.12 IV
Herpes Simplex Vrs I&II-IgM Ab (EIA): 1.37 INDEX — ABNORMAL HIGH

## 2014-08-27 ENCOUNTER — Ambulatory Visit (INDEPENDENT_AMBULATORY_CARE_PROVIDER_SITE_OTHER): Payer: BLUE CROSS/BLUE SHIELD | Admitting: Family Medicine

## 2014-08-27 ENCOUNTER — Encounter: Payer: Self-pay | Admitting: Family Medicine

## 2014-08-27 VITALS — BP 110/68 | HR 74 | Temp 97.6°F | Resp 16 | Ht 70.5 in | Wt 147.0 lb

## 2014-08-27 DIAGNOSIS — B009 Herpesviral infection, unspecified: Secondary | ICD-10-CM | POA: Diagnosis not present

## 2014-08-27 MED ORDER — VALACYCLOVIR HCL 500 MG PO TABS: 500.0000 mg | ORAL_TABLET | Freq: Every day | ORAL | Status: DC
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
## 2014-09-06 ENCOUNTER — Emergency Department (HOSPITAL_COMMUNITY)
Admission: EM | Admit: 2014-09-06 | Discharge: 2014-09-06 | Disposition: A | Discharge status: Home / Self Care | Payer: BLUE CROSS/BLUE SHIELD | Attending: Emergency Medicine | Admitting: Emergency Medicine

## 2014-09-06 ENCOUNTER — Encounter (HOSPITAL_COMMUNITY): Payer: Self-pay | Admitting: *Deleted

## 2014-09-06 DIAGNOSIS — Z8739 Personal history of other diseases of the musculoskeletal system and connective tissue: Secondary | ICD-10-CM | POA: Insufficient documentation

## 2014-09-06 DIAGNOSIS — R42 Dizziness and giddiness: Secondary | ICD-10-CM | POA: Insufficient documentation

## 2014-09-06 DIAGNOSIS — R200 Anesthesia of skin: Secondary | ICD-10-CM | POA: Diagnosis not present

## 2014-09-06 DIAGNOSIS — R202 Paresthesia of skin: Secondary | ICD-10-CM | POA: Insufficient documentation

## 2014-09-06 DIAGNOSIS — Z79899 Other long term (current) drug therapy: Secondary | ICD-10-CM | POA: Insufficient documentation

## 2014-09-06 DIAGNOSIS — Z8659 Personal history of other mental and behavioral disorders: Secondary | ICD-10-CM | POA: Insufficient documentation

## 2014-09-06 LAB — URINALYSIS, ROUTINE W REFLEX MICROSCOPIC
Bilirubin Urine: NEGATIVE
Glucose, UA: NEGATIVE mg/dL
Hgb urine dipstick: NEGATIVE
KETONES UR: NEGATIVE mg/dL
LEUKOCYTES UA: NEGATIVE
NITRITE: NEGATIVE
PROTEIN: NEGATIVE mg/dL
Specific Gravity, Urine: 1.008 (ref 1.005–1.030)
Urobilinogen, UA: 0.2 mg/dL (ref 0.0–1.0)
pH: 7 (ref 5.0–8.0)

## 2014-09-06 LAB — CBC
HEMATOCRIT: 38.8 % — AB (ref 39.0–52.0)
Hemoglobin: 13.1 g/dL (ref 13.0–17.0)
MCH: 30.5 pg (ref 26.0–34.0)
MCHC: 33.8 g/dL (ref 30.0–36.0)
MCV: 90.4 fL (ref 78.0–100.0)
Platelets: 163 10*3/uL (ref 150–400)
RBC: 4.29 MIL/uL (ref 4.22–5.81)
RDW: 12.5 % (ref 11.5–15.5)
WBC: 5.1 10*3/uL (ref 4.0–10.5)

## 2014-09-06 LAB — BASIC METABOLIC PANEL
ANION GAP: 5 (ref 5–15)
BUN: 14 mg/dL (ref 6–23)
CO2: 27 mmol/L (ref 19–32)
Calcium: 8.1 mg/dL — ABNORMAL LOW (ref 8.4–10.5)
Chloride: 109 mmol/L (ref 96–112)
Creatinine, Ser: 0.75 mg/dL (ref 0.50–1.35)
GFR calc non Af Amer: >90 mL/min (ref >90)
Glucose, Bld: 100 mg/dL — ABNORMAL HIGH (ref 70–99)
POTASSIUM: 3.7 mmol/L (ref 3.5–5.1)
Sodium: 141 mmol/L (ref 135–145)

## 2014-09-06 LAB — CK: Total CK: 117 U/L (ref 7–232)

## 2014-09-06 LAB — CBG MONITORING, ED: Glucose-Capillary: 93 mg/dL (ref 70–99)

## 2014-09-06 MED ORDER — SODIUM CHLORIDE 0.9 % IV BOLUS (SEPSIS)
1000.0000 mL | Freq: Once | INTRAVENOUS | Status: AC
  Administered 2014-09-06: 1000 mL via INTRAVENOUS

## 2014-09-06 NOTE — ED Notes (Signed)
Bed: [facility] Expected date:  Expected time:  Means of arrival:  Comments: EMS 

## 2014-09-06 NOTE — ED Notes (Signed)
Per ems pt is from urgent care, c/o bil arm and leg numbness and tingling. 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 Pt did eat breakfast, minimal lunch. Minimal to moderate amount of fluid intake, prone to dehydration. Similar to dehydration episodes in past. 18 R forearm, NS infused. Pt reports he feels better but still having numbness and tingling to arms and legs. Denies pain.
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
## 2014-09-06 NOTE — ED Notes (Signed)
Pt escorted to discharge window. Pt verbalized understanding discharge instructions. In no acute distress.  

## 2014-09-06 NOTE — ED Notes (Signed)
Pt alert and oriented x4. Respirations even and unlabored, bilateral symmetrical rise and fall of chest. Skin warm and dry. In no acute distress. Denies needs.   

## 2014-09-06 NOTE — Discharge Instructions (Signed)
As discussed, your evaluation today has been largely reassuring.  But, it is important that you monitor your condition carefully, and do not hesitate to return to the ED if you develop new, or concerning changes in your condition. ? ?Otherwise, please follow-up with your physician for appropriate ongoing care. ? ?

## 2014-09-06 NOTE — ED Notes (Signed)
md at bedside

## 2014-09-13 ENCOUNTER — Telehealth: Payer: Self-pay | Admitting: Family Medicine

## 2014-09-13 MED ORDER — CLONAZEPAM 0.5 MG PO TABS: 0.2500 mg | ORAL_TABLET | Freq: Two times a day (BID) | ORAL | Status: DC | PRN

## 2014-09-13 NOTE — Telephone Encounter (Signed)
[phone] PT states that Dr Tanya NonesPickard wrote the pt a rx for Klonopin he is needing a refill on this because of his anxiety.

## 2014-09-13 NOTE — Telephone Encounter (Signed)
?   OK to Refill  

## 2014-09-13 NOTE — Telephone Encounter (Signed)
Med called to pharm and pt aware 

## 2014-09-13 NOTE — Telephone Encounter (Signed)
Okay 

## 2014-10-01 ENCOUNTER — Ambulatory Visit (INDEPENDENT_AMBULATORY_CARE_PROVIDER_SITE_OTHER): Payer: BLUE CROSS/BLUE SHIELD | Admitting: Family Medicine

## 2014-10-01 ENCOUNTER — Encounter: Payer: Self-pay | Admitting: Family Medicine

## 2014-10-01 VITALS — BP 100/64 | HR 76 | Temp 98.6°F | Resp 14 | Ht 70.5 in | Wt 146.0 lb

## 2014-10-01 DIAGNOSIS — J302 Other seasonal allergic rhinitis: Secondary | ICD-10-CM

## 2014-10-01 DIAGNOSIS — Z87438 Personal history of other diseases of male genital organs: Secondary | ICD-10-CM

## 2014-10-01 DIAGNOSIS — J029 Acute pharyngitis, unspecified: Secondary | ICD-10-CM | POA: Diagnosis not present

## 2014-10-01 LAB — RAPID STREP SCREEN (MED CTR MEBANE ONLY): Streptococcus, Group A Screen (Direct): NEGATIVE

## 2014-10-01 MED ORDER — FLUTICASONE PROPIONATE 50 MCG/ACT NA SUSP: 2.0000 | Freq: Every day | NASAL | Status: DC
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
## 2014-10-04 ENCOUNTER — Ambulatory Visit (INDEPENDENT_AMBULATORY_CARE_PROVIDER_SITE_OTHER): Payer: BLUE CROSS/BLUE SHIELD | Admitting: Physician Assistant

## 2014-10-04 ENCOUNTER — Encounter: Payer: Self-pay | Admitting: Physician Assistant

## 2014-10-04 VITALS — BP 104/66 | HR 56 | Temp 97.8°F | Resp 18 | Wt 146.0 lb

## 2014-10-04 DIAGNOSIS — L237 Allergic contact dermatitis due to plants, except food: Secondary | ICD-10-CM | POA: Diagnosis not present

## 2014-10-04 MED ORDER — PREDNISONE 20 MG PO TABS: ORAL_TABLET | ORAL | Status: DC

## 2014-10-04 MED ORDER — METHYLPREDNISOLONE ACETATE 80 MG/ML IJ SUSP
60.0000 mg | Freq: Once | INTRAMUSCULAR | Status: AC
  Administered 2014-10-04: 60 mg via INTRAMUSCULAR

## 2014-10-04 NOTE — Progress Notes (Signed)
    Patient ID: Michael Powers MRN: [id number], DOB: 07/28/1980, 34 y.o. Date of Encounter: 10/04/2014, 10:32 AM    Chief Complaint:  Chief Complaint  Patient presents with  . poison ivy    mostly hands and feet     HPI: 34 y.o. year old white male says that the area where he was working Monday--he knew there was poison ivy there-- so he was using a tractor. However says that he must have still come in contact with the poison ivy somehow. Says it was dusty so he doesn't know if some of the particles blew on to him in the dust or once they were walking in the area afterwards whether there was some poison ivy there that got in contact through his shoe. Nonetheless he says he was doing this work on Monday 10/01/14. Says that yesterday 10/03/14 is when he started having itchy rash. Started on his left foot and toes and now is spreading to his left calf. Also started on his left fingers and hand and now is moving up his left forearm.     Home Meds:   Outpatient Prescriptions Prior to Visit  Medication Sig Dispense Refill  . clonazePAM (KLONOPIN) 0.5 MG tablet Take 0.5 tablets (0.25 mg total) by mouth 2 (two) times daily as needed for anxiety. 30 tablet 0  . fluticasone (FLONASE) 50 MCG/ACT nasal spray Place 2 sprays into both nostrils daily. 16 g 6  . valACYclovir (VALTREX) 500 MG tablet Take 1 tablet (500 mg total) by mouth daily. (Patient not taking: Reported on 10/04/2014) 30 tablet 2   No facility-administered medications prior to visit.    Allergies:  Allergies  Allergen Reactions  . Bee Venom Shortness Of Breath and Swelling      Review of Systems: See HPI for pertinent ROS. All other ROS negative.    Physical Exam: Blood pressure 104/66, pulse 56, temperature 97.8 F (36.6 C), temperature source Oral, resp. rate 18, weight 146 lb (66.225 kg)., Body mass index is 20.65 kg/(m^2). General:  WNWD WM. Appears in no acute distress. Neck: Supple. No thyromegaly. No  lymphadenopathy. Lungs: Clear bilaterally to auscultation without wheezes, rales, or rhonchi. Breathing is unlabored. Heart: Regular rhythm. No murmurs, rubs, or gallops. Msk:  Strength and tone normal for age. Extremities/Skin: Left Fingers--Areas of pink erythema and pink papules/vessicles on fingers. Left hand with area of diffuse pink erythema.  Left Foot--Pink papules/vessicles between toes. Pink papules and areas of diffuse erythema on left left foot. Left calf with splotches of pink papules and areas of diffuse pink erythema. Neuro: Alert and oriented X 3. Moves all extremities spontaneously. Gait is normal. CNII-XII grossly in tact. Psych:  Responds to questions appropriately with a normal affect.     ASSESSMENT AND PLAN:  34 y.o. year old male with  1. Allergic dermatitis due to poison ivy He wants to go ahead and get injection of prednisone. Will give Depo-Medrol 60 mg IM now. He will then start the oral taper tomorrow. He has had multiple episodes of poison ivy in the past and is very allergic to this so we'll go ahead with injection followed by oral taper. Follow-up if does not resolve with this. - predniSONE (DELTASONE) 20 MG tablet; Take 3 daily for 2 days, then 2 daily for 2 days, then 1 daily for 2 days.  Dispense: 12 tablet; Refill: 0   Signed, [name], [place], [facility] 10/04/2014 10:32 AM

## 2014-11-27 ENCOUNTER — Telehealth: Payer: Self-pay | Admitting: Family Medicine

## 2014-11-27 NOTE — Telephone Encounter (Signed)
ok 

## 2014-11-27 NOTE — Telephone Encounter (Signed)
?   OK to Refill  

## 2014-11-27 NOTE — Telephone Encounter (Signed)
Patient asking for rx for his klonopin if possible  [phone]

## 2014-11-28 MED ORDER — CLONAZEPAM 0.5 MG PO TABS: 0.2500 mg | ORAL_TABLET | Freq: Two times a day (BID) | ORAL | Status: DC | PRN

## 2014-11-28 NOTE — Telephone Encounter (Signed)
Medication called/sent to requested pharmacy  

## 2015-01-17 ENCOUNTER — Ambulatory Visit (INDEPENDENT_AMBULATORY_CARE_PROVIDER_SITE_OTHER): Payer: BLUE CROSS/BLUE SHIELD | Admitting: Family Medicine

## 2015-01-17 ENCOUNTER — Encounter: Payer: Self-pay | Admitting: Family Medicine

## 2015-01-17 VITALS — BP 110/68 | HR 68 | Temp 98.0°F | Resp 16 | Ht 70.5 in | Wt 145.0 lb

## 2015-01-17 DIAGNOSIS — F411 Generalized anxiety disorder: Secondary | ICD-10-CM | POA: Diagnosis not present

## 2015-01-17 DIAGNOSIS — J019 Acute sinusitis, unspecified: Secondary | ICD-10-CM

## 2015-01-17 MED ORDER — ESCITALOPRAM OXALATE 10 MG PO TABS
10.0000 mg | ORAL_TABLET | Freq: Every day | ORAL | Status: DC
Start: 2015-01-17 — End: 2015-08-06

## 2015-01-17 MED ORDER — AMOXICILLIN 875 MG PO TABS: 875.0000 mg | ORAL_TABLET | Freq: Two times a day (BID) | ORAL | Status: DC
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
## 2015-02-25 ENCOUNTER — Ambulatory Visit (INDEPENDENT_AMBULATORY_CARE_PROVIDER_SITE_OTHER): Payer: BLUE CROSS/BLUE SHIELD | Admitting: Family Medicine

## 2015-02-25 ENCOUNTER — Encounter: Payer: Self-pay | Admitting: Family Medicine

## 2015-02-25 ENCOUNTER — Ambulatory Visit: Payer: BLUE CROSS/BLUE SHIELD | Admitting: Family Medicine

## 2015-02-25 VITALS — BP 112/64 | HR 64 | Temp 98.1°F | Resp 14 | Ht 70.5 in | Wt 145.0 lb

## 2015-02-25 DIAGNOSIS — B084 Enteroviral vesicular stomatitis with exanthem: Secondary | ICD-10-CM | POA: Diagnosis not present

## 2015-02-25 MED ORDER — MOMETASONE FUROATE 0.1 % EX CREA: 1.0000 "application " | TOPICAL_CREAM | Freq: Every day | CUTANEOUS | Status: DC

## 2015-02-25 MED ORDER — IBUPROFEN 600 MG PO TABS: 600.0000 mg | ORAL_TABLET | Freq: Three times a day (TID) | ORAL | Status: DC | PRN
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
## 2015-03-12 ENCOUNTER — Encounter: Payer: Self-pay | Admitting: Family Medicine

## 2015-03-12 ENCOUNTER — Ambulatory Visit (INDEPENDENT_AMBULATORY_CARE_PROVIDER_SITE_OTHER): Payer: BLUE CROSS/BLUE SHIELD | Admitting: Family Medicine

## 2015-03-12 VITALS — BP 90/68 | HR 62 | Temp 97.6°F | Resp 14 | Ht 70.5 in | Wt 143.0 lb

## 2015-03-12 DIAGNOSIS — R0602 Shortness of breath: Secondary | ICD-10-CM | POA: Diagnosis not present

## 2015-03-12 DIAGNOSIS — R002 Palpitations: Secondary | ICD-10-CM

## 2015-03-12 DIAGNOSIS — R05 Cough: Secondary | ICD-10-CM

## 2015-03-12 DIAGNOSIS — R059 Cough, unspecified: Secondary | ICD-10-CM
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
## 2015-08-06 ENCOUNTER — Ambulatory Visit (INDEPENDENT_AMBULATORY_CARE_PROVIDER_SITE_OTHER): Payer: BLUE CROSS/BLUE SHIELD | Admitting: Family Medicine

## 2015-08-06 ENCOUNTER — Encounter: Payer: Self-pay | Admitting: Family Medicine

## 2015-08-06 VITALS — BP 132/72 | HR 68 | Temp 97.9°F | Resp 14 | Ht 70.5 in | Wt 150.0 lb

## 2015-08-06 DIAGNOSIS — F411 Generalized anxiety disorder: Secondary | ICD-10-CM | POA: Diagnosis not present

## 2015-08-06 MED ORDER — ESCITALOPRAM OXALATE 10 MG PO TABS: 10.0000 mg | ORAL_TABLET | Freq: Every day | ORAL | Status: DC

## 2015-08-06 MED ORDER — CLONAZEPAM 0.5 MG PO TABS: 0.2500 mg | ORAL_TABLET | Freq: Three times a day (TID) | ORAL | Status: DC | PRN
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
## 2015-08-20 ENCOUNTER — Ambulatory Visit (INDEPENDENT_AMBULATORY_CARE_PROVIDER_SITE_OTHER): Payer: BLUE CROSS/BLUE SHIELD | Admitting: Family Medicine

## 2015-08-20 ENCOUNTER — Encounter: Payer: Self-pay | Admitting: Family Medicine

## 2015-08-20 VITALS — BP 110/68 | HR 70 | Temp 97.8°F | Resp 14 | Ht 70.5 in | Wt 149.0 lb

## 2015-08-20 DIAGNOSIS — F411 Generalized anxiety disorder: Secondary | ICD-10-CM | POA: Diagnosis not present
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
## 2015-09-20 DIAGNOSIS — F411 Generalized anxiety disorder: Secondary | ICD-10-CM | POA: Diagnosis not present

## 2015-09-24 DIAGNOSIS — F411 Generalized anxiety disorder: Secondary | ICD-10-CM | POA: Diagnosis not present

## 2015-10-04 DIAGNOSIS — F411 Generalized anxiety disorder: Secondary | ICD-10-CM | POA: Diagnosis not present

## 2015-10-15 DIAGNOSIS — F411 Generalized anxiety disorder: Secondary | ICD-10-CM | POA: Diagnosis not present

## 2015-10-26 ENCOUNTER — Emergency Department (HOSPITAL_COMMUNITY)
Admission: EM | Admit: 2015-10-26 | Discharge: 2015-10-26 | Disposition: A | Discharge status: Home / Self Care | Payer: BLUE CROSS/BLUE SHIELD | Attending: Emergency Medicine | Admitting: Emergency Medicine

## 2015-10-26 ENCOUNTER — Emergency Department (HOSPITAL_COMMUNITY): Payer: BLUE CROSS/BLUE SHIELD

## 2015-10-26 ENCOUNTER — Encounter (HOSPITAL_COMMUNITY): Payer: Self-pay | Admitting: Emergency Medicine

## 2015-10-26 DIAGNOSIS — Z7951 Long term (current) use of inhaled steroids: Secondary | ICD-10-CM | POA: Diagnosis not present

## 2015-10-26 DIAGNOSIS — F329 Major depressive disorder, single episode, unspecified: Secondary | ICD-10-CM | POA: Diagnosis not present

## 2015-10-26 DIAGNOSIS — Y999 Unspecified external cause status: Secondary | ICD-10-CM | POA: Insufficient documentation

## 2015-10-26 DIAGNOSIS — M5136 Other intervertebral disc degeneration, lumbar region: Secondary | ICD-10-CM | POA: Diagnosis not present

## 2015-10-26 DIAGNOSIS — Y939 Activity, unspecified: Secondary | ICD-10-CM | POA: Insufficient documentation

## 2015-10-26 DIAGNOSIS — S61411A Laceration without foreign body of right hand, initial encounter: Secondary | ICD-10-CM | POA: Diagnosis not present

## 2015-10-26 DIAGNOSIS — Y929 Unspecified place or not applicable: Secondary | ICD-10-CM | POA: Insufficient documentation

## 2015-10-26 DIAGNOSIS — W010XXA Fall on same level from slipping, tripping and stumbling without subsequent striking against object, initial encounter: Secondary | ICD-10-CM | POA: Diagnosis not present

## 2015-10-26 DIAGNOSIS — Z79899 Other long term (current) drug therapy: Secondary | ICD-10-CM | POA: Diagnosis not present

## 2015-10-26 MED ORDER — LIDOCAINE HCL 2 % IJ SOLN
5.0000 mL | Freq: Once | INTRAMUSCULAR | Status: AC
  Administered 2015-10-26: 100 mg via INTRADERMAL
  Filled 2015-10-26: qty 20

## 2015-10-26 NOTE — ED Notes (Signed)
Patient d/c'd self care.  F/U reviewed.  Patient verbalized understanding. 

## 2015-10-26 NOTE — Discharge Instructions (Signed)
Laceration Care, Adult  A laceration is a cut that goes through all layers of the skin. The cut also goes into the tissue that is right under the skin. Some cuts heal on their own. Others need to be closed with stitches (sutures), staples, skin adhesive strips, or wound glue. Taking care of your cut lowers your risk of infection and helps your cut to heal better.  HOW TO TAKE CARE OF YOUR CUT  For stitches or staples:  · Keep the wound clean and dry.  · If you were given a bandage (dressing), you should change it at least one time per day or as told by your doctor. You should also change it if it gets wet or dirty.  · Keep the wound completely dry for the first 24 hours or as told by your doctor. After that time, you may take a shower or a bath. However, make sure that the wound is not soaked in water until after the stitches or staples have been removed.  · Clean the wound one time each day or as told by your doctor:    Wash the wound with soap and water.    Rinse the wound with water until all of the soap comes off.    Pat the wound dry with a clean towel. Do not rub the wound.  · After you clean the wound, put a thin layer of antibiotic ointment on it as told by your doctor. This ointment:    Helps to prevent infection.    Keeps the bandage from sticking to the wound.  · Have your stitches or staples removed as told by your doctor.  If your doctor used skin adhesive strips:   · Keep the wound clean and dry.  · If you were given a bandage, you should change it at least one time per day or as told by your doctor. You should also change it if it gets dirty or wet.  · Do not get the skin adhesive strips wet. You can take a shower or a bath, but be careful to keep the wound dry.  · If the wound gets wet, pat it dry with a clean towel. Do not rub the wound.  · Skin adhesive strips fall off on their own. You can trim the strips as the wound heals. Do not remove any strips that are still stuck to the wound. They will  fall off after a while.  If your doctor used wound glue:  · Try to keep your wound dry, but you may briefly wet it in the shower or bath. Do not soak the wound in water, such as by swimming.  · After you take a shower or a bath, gently pat the wound dry with a clean towel. Do not rub the wound.  · Do not do any activities that will make you really sweaty until the skin glue has fallen off on its own.  · Do not apply liquid, cream, or ointment medicine to your wound while the skin glue is still on.  · If you were given a bandage, you should change it at least one time per day or as told by your doctor. You should also change it if it gets dirty or wet.  · If a bandage is placed over the wound, do not let the tape for the bandage touch the skin glue.  · Do not pick at the glue. The skin glue usually stays on for 5-10 days. Then, it   falls off of the skin.  General Instructions   · To help prevent scarring, make sure to cover your wound with sunscreen whenever you are outside after stitches are removed, after adhesive strips are removed, or when wound glue stays in place and the wound is healed. Make sure to wear a sunscreen of at least 30 SPF.  · Take over-the-counter and prescription medicines only as told by your doctor.  · If you were given antibiotic medicine or ointment, take or apply it as told by your doctor. Do not stop using the antibiotic even if your wound is getting better.  · Do not scratch or pick at the wound.  · Keep all follow-up visits as told by your doctor. This is important.  · Check your wound every day for signs of infection. Watch for:    Redness, swelling, or pain.    Fluid, blood, or pus.  · Raise (elevate) the injured area above the level of your heart while you are sitting or lying down, if possible.  GET HELP IF:  · You got a tetanus shot and you have any of these problems at the injection site:    Swelling.    Very bad pain.    Redness.    Bleeding.  · You have a fever.  · A wound that was  closed breaks open.  · You notice a bad smell coming from your wound or your bandage.  · You notice something coming out of the wound, such as wood or glass.  · Medicine does not help your pain.  · You have more redness, swelling, or pain at the site of your wound.  · You have fluid, blood, or pus coming from your wound.  · You notice a change in the color of your skin near your wound.  · You need to change the bandage often because fluid, blood, or pus is coming from the wound.  · You start to have a new rash.  · You start to have numbness around the wound.  GET HELP RIGHT AWAY IF:  · You have very bad swelling around the wound.  · Your pain suddenly gets worse and is very bad.  · You notice painful lumps near the wound or on skin that is anywhere on your body.  · You have a red streak going away from your wound.  · The wound is on your hand or foot and you cannot move a finger or toe like you usually can.  · The wound is on your hand or foot and you notice that your fingers or toes look pale or bluish.     This information is not intended to replace advice given to you by your health care provider. Make sure you discuss any questions you have with your health care provider.     Document Released: 11/18/2007 Document Revised: 10/16/2014 Document Reviewed: 05/28/2014  [employer]vier Interactive Patient Education ©2016 [employer]

## 2015-10-26 NOTE — ED Notes (Signed)
Pt from home with a laceration on the palm of his right hand. Pt states he slipped on a wet rock and landed on another rock. The laceration is about three inches long and the bleeding is controlled.
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
## 2015-11-04 ENCOUNTER — Ambulatory Visit (INDEPENDENT_AMBULATORY_CARE_PROVIDER_SITE_OTHER): Payer: BLUE CROSS/BLUE SHIELD | Admitting: Family Medicine

## 2015-11-04 ENCOUNTER — Encounter: Payer: Self-pay | Admitting: Family Medicine

## 2015-11-04 VITALS — BP 120/60 | HR 62 | Temp 98.2°F | Resp 18 | Ht 70.5 in | Wt 146.0 lb

## 2015-11-04 DIAGNOSIS — F411 Generalized anxiety disorder: Secondary | ICD-10-CM

## 2015-11-04 DIAGNOSIS — L259 Unspecified contact dermatitis, unspecified cause: Secondary | ICD-10-CM

## 2015-11-04 DIAGNOSIS — Z4802 Encounter for removal of sutures: Secondary | ICD-10-CM | POA: Diagnosis not present

## 2015-11-04 MED ORDER — PREDNISONE 20 MG PO TABS: ORAL_TABLET | ORAL | Status: DC

## 2015-11-04 MED ORDER — ESCITALOPRAM OXALATE 10 MG PO TABS: 10.0000 mg | ORAL_TABLET | Freq: Every day | ORAL | Status: DC
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
## 2015-11-12 DIAGNOSIS — B0089 Other herpesviral infection: Secondary | ICD-10-CM | POA: Diagnosis not present

## 2015-11-25 ENCOUNTER — Encounter (HOSPITAL_COMMUNITY): Payer: Self-pay | Admitting: Emergency Medicine

## 2015-11-25 ENCOUNTER — Emergency Department (HOSPITAL_COMMUNITY)
Admission: EM | Admit: 2015-11-25 | Discharge: 2015-11-26 | Disposition: A | Discharge status: Home / Self Care | Payer: BLUE CROSS/BLUE SHIELD | Attending: Emergency Medicine | Admitting: Emergency Medicine

## 2015-11-25 ENCOUNTER — Emergency Department (HOSPITAL_COMMUNITY): Payer: BLUE CROSS/BLUE SHIELD

## 2015-11-25 DIAGNOSIS — R531 Weakness: Secondary | ICD-10-CM | POA: Diagnosis not present

## 2015-11-25 DIAGNOSIS — R51 Headache: Secondary | ICD-10-CM | POA: Diagnosis not present

## 2015-11-25 DIAGNOSIS — Z791 Long term (current) use of non-steroidal anti-inflammatories (NSAID): Secondary | ICD-10-CM | POA: Diagnosis not present

## 2015-11-25 DIAGNOSIS — Z79899 Other long term (current) drug therapy: Secondary | ICD-10-CM | POA: Diagnosis not present

## 2015-11-25 DIAGNOSIS — Z7982 Long term (current) use of aspirin: Secondary | ICD-10-CM | POA: Insufficient documentation

## 2015-11-25 DIAGNOSIS — R519 Headache, unspecified: Secondary | ICD-10-CM

## 2015-11-25 LAB — COMPREHENSIVE METABOLIC PANEL
ALT: 19 U/L (ref 17–63)
AST: 19 U/L (ref 15–41)
Albumin: 4.3 g/dL (ref 3.5–5.0)
Alkaline Phosphatase: 39 U/L (ref 38–126)
Anion gap: 8 (ref 5–15)
BUN: 15 mg/dL (ref 6–20)
CHLORIDE: 104 mmol/L (ref 101–111)
CO2: 25 mmol/L (ref 22–32)
Calcium: 9 mg/dL (ref 8.9–10.3)
Creatinine, Ser: 0.99 mg/dL (ref 0.61–1.24)
Glucose, Bld: 125 mg/dL — ABNORMAL HIGH (ref 65–99)
POTASSIUM: 3.5 mmol/L (ref 3.5–5.1)
Sodium: 137 mmol/L (ref 135–145)
Total Bilirubin: 0.7 mg/dL (ref 0.3–1.2)
Total Protein: 7 g/dL (ref 6.5–8.1)

## 2015-11-25 LAB — CBC
HEMATOCRIT: 41.8 % (ref 39.0–52.0)
Hemoglobin: 14.6 g/dL (ref 13.0–17.0)
MCH: 30.9 pg (ref 26.0–34.0)
MCHC: 34.9 g/dL (ref 30.0–36.0)
MCV: 88.6 fL (ref 78.0–100.0)
Platelets: 257 10*3/uL (ref 150–400)
RBC: 4.72 MIL/uL (ref 4.22–5.81)
RDW: 12.5 % (ref 11.5–15.5)
WBC: 11.8 10*3/uL — AB (ref 4.0–10.5)

## 2015-11-25 LAB — URINALYSIS, ROUTINE W REFLEX MICROSCOPIC
Bilirubin Urine: NEGATIVE
GLUCOSE, UA: NEGATIVE mg/dL
Hgb urine dipstick: NEGATIVE
Ketones, ur: NEGATIVE mg/dL
LEUKOCYTES UA: NEGATIVE
Nitrite: NEGATIVE
PH: 8 (ref 5.0–8.0)
Protein, ur: NEGATIVE mg/dL
Specific Gravity, Urine: 1.01 (ref 1.005–1.030)

## 2015-11-25 MED ORDER — SODIUM CHLORIDE 0.9 % IV BOLUS (SEPSIS)
2000.0000 mL | Freq: Once | INTRAVENOUS | Status: AC
Start: 2015-11-25 — End: 2015-11-26
  Administered 2015-11-25: 2000 mL via INTRAVENOUS

## 2015-11-25 MED ORDER — IBUPROFEN 200 MG PO TABS
400.0000 mg | ORAL_TABLET | Freq: Once | ORAL | Status: AC | PRN
  Administered 2015-11-25: 400 mg via ORAL
  Filled 2015-11-25: qty 2

## 2015-11-25 MED ORDER — METOCLOPRAMIDE HCL 5 MG/ML IJ SOLN
10.0000 mg | Freq: Once | INTRAMUSCULAR | Status: AC
  Administered 2015-11-25: 10 mg via INTRAVENOUS
  Filled 2015-11-25: qty 2

## 2015-11-25 MED ORDER — DIPHENHYDRAMINE HCL 50 MG/ML IJ SOLN
12.5000 mg | Freq: Once | INTRAMUSCULAR | Status: AC
  Administered 2015-11-25: 12.5 mg via INTRAVENOUS
  Filled 2015-11-25: qty 1

## 2015-11-25 MED ORDER — HYDROMORPHONE HCL 1 MG/ML IJ SOLN
0.5000 mg | Freq: Once | INTRAMUSCULAR | Status: AC
  Administered 2015-11-25: 0.5 mg via INTRAVENOUS
  Filled 2015-11-25: qty 1

## 2015-11-25 MED ORDER — KETOROLAC TROMETHAMINE 30 MG/ML IJ SOLN
30.0000 mg | Freq: Once | INTRAMUSCULAR | Status: AC
  Administered 2015-11-25: 30 mg via INTRAVENOUS
  Filled 2015-11-25: qty 1

## 2015-11-25 NOTE — ED Notes (Signed)
Pt states that he was working in the hot sun all day yesterday and believes that he is dehydrated. States that he has been cramping and has had a headache. Has gotten dehydrated before. Alert and oriented.
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
## 2015-11-25 NOTE — ED Notes (Signed)
Patient provided with water and crackers after verbal [place] from [place].

## 2015-11-25 NOTE — Discharge Instructions (Signed)

## 2015-12-04 DIAGNOSIS — F411 Generalized anxiety disorder: Secondary | ICD-10-CM | POA: Diagnosis not present

## 2015-12-13 DIAGNOSIS — F411 Generalized anxiety disorder: Secondary | ICD-10-CM | POA: Diagnosis not present

## 2015-12-25 DIAGNOSIS — F411 Generalized anxiety disorder: Secondary | ICD-10-CM | POA: Diagnosis not present

## 2016-01-02 ENCOUNTER — Encounter: Payer: Self-pay | Admitting: Family Medicine

## 2016-01-02 ENCOUNTER — Ambulatory Visit (INDEPENDENT_AMBULATORY_CARE_PROVIDER_SITE_OTHER): Payer: BLUE CROSS/BLUE SHIELD | Admitting: Family Medicine

## 2016-01-02 VITALS — BP 120/90 | HR 68 | Temp 97.8°F | Resp 16 | Ht 70.5 in | Wt 147.0 lb

## 2016-01-02 DIAGNOSIS — H9192 Unspecified hearing loss, left ear: Secondary | ICD-10-CM

## 2016-01-02 MED ORDER — PREDNISONE 20 MG PO TABS: ORAL_TABLET | ORAL | Status: DC
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
## 2016-01-27 DIAGNOSIS — F411 Generalized anxiety disorder: Secondary | ICD-10-CM | POA: Diagnosis not present

## 2016-02-10 DIAGNOSIS — F411 Generalized anxiety disorder: Secondary | ICD-10-CM | POA: Diagnosis not present

## 2016-03-06 DIAGNOSIS — H9312 Tinnitus, left ear: Secondary | ICD-10-CM | POA: Diagnosis not present

## 2016-03-06 DIAGNOSIS — H9192 Unspecified hearing loss, left ear: Secondary | ICD-10-CM | POA: Diagnosis not present

## 2016-03-12 DIAGNOSIS — F411 Generalized anxiety disorder: Secondary | ICD-10-CM | POA: Diagnosis not present

## 2016-04-02 DIAGNOSIS — F411 Generalized anxiety disorder: Secondary | ICD-10-CM | POA: Diagnosis not present

## 2016-04-10 ENCOUNTER — Other Ambulatory Visit: Payer: Self-pay | Admitting: Family Medicine

## 2016-04-10 DIAGNOSIS — F411 Generalized anxiety disorder: Secondary | ICD-10-CM

## 2016-04-10 MED ORDER — ESCITALOPRAM OXALATE 10 MG PO TABS: 10.0000 mg | ORAL_TABLET | Freq: Every day | ORAL | 0 refills | Status: DC

## 2016-04-10 NOTE — Telephone Encounter (Signed)
Medication refill for one time only.  Patient needs to be seen.  Letter sent for patient to call and schedule 

## 2016-04-20 DIAGNOSIS — F411 Generalized anxiety disorder: Secondary | ICD-10-CM | POA: Diagnosis not present

## 2016-04-20 DIAGNOSIS — L237 Allergic contact dermatitis due to plants, except food: Secondary | ICD-10-CM | POA: Diagnosis not present

## 2016-05-14 DIAGNOSIS — F411 Generalized anxiety disorder: Secondary | ICD-10-CM | POA: Diagnosis not present

## 2016-06-17 DIAGNOSIS — F411 Generalized anxiety disorder: Secondary | ICD-10-CM | POA: Diagnosis not present

## 2016-06-18 ENCOUNTER — Ambulatory Visit (INDEPENDENT_AMBULATORY_CARE_PROVIDER_SITE_OTHER): Payer: BLUE CROSS/BLUE SHIELD | Admitting: Family Medicine

## 2016-06-18 VITALS — BP 134/76 | HR 76 | Temp 97.9°F | Resp 14 | Ht 70.5 in | Wt 161.0 lb

## 2016-06-18 DIAGNOSIS — R030 Elevated blood-pressure reading, without diagnosis of hypertension: Secondary | ICD-10-CM
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
## 2016-06-24 DIAGNOSIS — F411 Generalized anxiety disorder: Secondary | ICD-10-CM | POA: Diagnosis not present

## 2016-07-07 DIAGNOSIS — F411 Generalized anxiety disorder: Secondary | ICD-10-CM | POA: Diagnosis not present

## 2016-07-31 ENCOUNTER — Ambulatory Visit (INDEPENDENT_AMBULATORY_CARE_PROVIDER_SITE_OTHER): Payer: BLUE CROSS/BLUE SHIELD | Admitting: Family Medicine

## 2016-07-31 ENCOUNTER — Encounter: Payer: Self-pay | Admitting: Family Medicine

## 2016-07-31 VITALS — BP 110/66 | HR 78 | Temp 98.2°F | Resp 14 | Ht 70.5 in | Wt 151.0 lb

## 2016-07-31 DIAGNOSIS — J208 Acute bronchitis due to other specified organisms: Secondary | ICD-10-CM | POA: Diagnosis not present

## 2016-07-31 MED ORDER — HYDROCODONE-HOMATROPINE 5-1.5 MG/5ML PO SYRP
5.0000 mL | ORAL_SOLUTION | Freq: Three times a day (TID) | ORAL | 0 refills | Status: DC | PRN
Start: 2016-07-31 — End: 2017-04-04
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
## 2016-08-13 DIAGNOSIS — F411 Generalized anxiety disorder: Secondary | ICD-10-CM | POA: Diagnosis not present

## 2016-08-19 DIAGNOSIS — F411 Generalized anxiety disorder: Secondary | ICD-10-CM | POA: Diagnosis not present

## 2016-10-08 DIAGNOSIS — F411 Generalized anxiety disorder: Secondary | ICD-10-CM | POA: Diagnosis not present

## 2016-11-04 DIAGNOSIS — F411 Generalized anxiety disorder: Secondary | ICD-10-CM | POA: Diagnosis not present

## 2016-11-12 DIAGNOSIS — F411 Generalized anxiety disorder: Secondary | ICD-10-CM | POA: Diagnosis not present

## 2016-11-26 DIAGNOSIS — F411 Generalized anxiety disorder: Secondary | ICD-10-CM | POA: Diagnosis not present

## 2016-12-07 DIAGNOSIS — J019 Acute sinusitis, unspecified: Secondary | ICD-10-CM | POA: Diagnosis not present

## 2016-12-07 DIAGNOSIS — T63461A Toxic effect of venom of wasps, accidental (unintentional), initial encounter: Secondary | ICD-10-CM | POA: Diagnosis not present

## 2016-12-09 ENCOUNTER — Ambulatory Visit: Payer: BLUE CROSS/BLUE SHIELD | Admitting: Physician Assistant

## 2016-12-11 ENCOUNTER — Encounter: Payer: Self-pay | Admitting: Family Medicine

## 2016-12-17 ENCOUNTER — Encounter: Payer: Self-pay | Admitting: Family Medicine

## 2016-12-30 DIAGNOSIS — F411 Generalized anxiety disorder: Secondary | ICD-10-CM | POA: Diagnosis not present

## 2017-01-27 DIAGNOSIS — F411 Generalized anxiety disorder: Secondary | ICD-10-CM | POA: Diagnosis not present

## 2017-01-27 DIAGNOSIS — L237 Allergic contact dermatitis due to plants, except food: Secondary | ICD-10-CM | POA: Diagnosis not present

## 2017-01-27 DIAGNOSIS — S60561A Insect bite (nonvenomous) of right hand, initial encounter: Secondary | ICD-10-CM | POA: Diagnosis not present

## 2017-03-03 DIAGNOSIS — F411 Generalized anxiety disorder: Secondary | ICD-10-CM | POA: Diagnosis not present

## 2017-03-22 DIAGNOSIS — F411 Generalized anxiety disorder: Secondary | ICD-10-CM | POA: Diagnosis not present

## 2017-04-04 ENCOUNTER — Encounter (HOSPITAL_COMMUNITY): Payer: Self-pay | Admitting: Emergency Medicine

## 2017-04-04 ENCOUNTER — Ambulatory Visit (HOSPITAL_COMMUNITY)
Admission: EM | Admit: 2017-04-04 | Discharge: 2017-04-04 | Disposition: A | Discharge status: Home / Self Care | Payer: BLUE CROSS/BLUE SHIELD | Attending: Emergency Medicine | Admitting: Emergency Medicine

## 2017-04-04 DIAGNOSIS — L089 Local infection of the skin and subcutaneous tissue, unspecified: Secondary | ICD-10-CM | POA: Diagnosis not present

## 2017-04-04 MED ORDER — CLINDAMYCIN HCL 300 MG PO CAPS: 300.0000 mg | ORAL_CAPSULE | Freq: Three times a day (TID) | ORAL | 0 refills | Status: DC

## 2017-04-04 MED ORDER — TRAMADOL HCL 50 MG PO TABS: 50.0000 mg | ORAL_TABLET | Freq: Four times a day (QID) | ORAL | 0 refills | Status: DC | PRN

## 2017-04-04 NOTE — Discharge Instructions (Signed)
Take the antibiotic as directed. If in 48-72 hours there is no improvement or is getting worse follow-up with the on call hand surgeon. Take ibuprofen 600 mg every 6 hours as needed and the tramadol as directed for pain
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
## 2017-04-04 NOTE — ED Triage Notes (Signed)
Pt c/o sore spot on his finger, states it looked like a zit, so he popped it yesterday, some "white stuff" came out, it got swollen. Pt stuck a needle in it to release pressure. Small swollen spot on R pointed finger.

## 2017-04-06 ENCOUNTER — Encounter: Payer: Self-pay | Admitting: Family Medicine

## 2017-04-06 ENCOUNTER — Ambulatory Visit (INDEPENDENT_AMBULATORY_CARE_PROVIDER_SITE_OTHER): Payer: BLUE CROSS/BLUE SHIELD | Admitting: Family Medicine

## 2017-04-06 VITALS — BP 126/64 | HR 64 | Temp 97.5°F | Resp 14 | Ht 70.5 in | Wt 144.0 lb

## 2017-04-06 DIAGNOSIS — L02511 Cutaneous abscess of right hand: Secondary | ICD-10-CM | POA: Diagnosis not present

## 2017-04-06 DIAGNOSIS — L089 Local infection of the skin and subcutaneous tissue, unspecified: Secondary | ICD-10-CM

## 2017-04-06 MED ORDER — OXYCODONE-ACETAMINOPHEN 5-325 MG PO TABS: 1.0000 | ORAL_TABLET | Freq: Three times a day (TID) | ORAL | 0 refills | Status: DC | PRN

## 2017-04-06 MED ORDER — SULFAMETHOXAZOLE-TRIMETHOPRIM 800-160 MG PO TABS: 1.0000 | ORAL_TABLET | Freq: Two times a day (BID) | ORAL | 0 refills | Status: DC

## 2017-04-06 NOTE — Addendum Note (Signed)
Addended by: Lynnea FerrierPICKARD, WARREN T on: 04/06/2017 12:29 PM   Modules accepted: Orders
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
## 2017-04-06 NOTE — Addendum Note (Signed)
Addended by: Legrand RamsWILLIS, SANDY B on: 04/06/2017 03:02 PM   Modules accepted: Orders

## 2017-04-08 DIAGNOSIS — L02511 Cutaneous abscess of right hand: Secondary | ICD-10-CM | POA: Diagnosis not present

## 2017-04-09 LAB — WOUND CULTURE
MICRO NUMBER: 81185296
SPECIMEN QUALITY:: ADEQUATE

## 2017-04-12 ENCOUNTER — Ambulatory Visit (INDEPENDENT_AMBULATORY_CARE_PROVIDER_SITE_OTHER): Payer: BLUE CROSS/BLUE SHIELD | Admitting: Family Medicine

## 2017-04-12 ENCOUNTER — Encounter: Payer: Self-pay | Admitting: Family Medicine

## 2017-04-12 VITALS — BP 100/58 | HR 62 | Temp 98.1°F | Resp 12 | Ht 70.5 in | Wt 144.0 lb

## 2017-04-12 DIAGNOSIS — L089 Local infection of the skin and subcutaneous tissue, unspecified: Secondary | ICD-10-CM

## 2017-04-12 DIAGNOSIS — H9202 Otalgia, left ear: Secondary | ICD-10-CM

## 2017-04-12 MED ORDER — NEOMYCIN-COLIST-HC-THONZONIUM 3.3-3-10-0.5 MG/ML OT SUSP: 4.0000 [drp] | Freq: Four times a day (QID) | OTIC | 0 refills | Status: DC
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
## 2017-04-14 DIAGNOSIS — B085 Enteroviral vesicular pharyngitis: Secondary | ICD-10-CM | POA: Diagnosis not present

## 2017-04-19 DIAGNOSIS — F411 Generalized anxiety disorder: Secondary | ICD-10-CM | POA: Diagnosis not present

## 2017-05-19 DIAGNOSIS — F411 Generalized anxiety disorder: Secondary | ICD-10-CM | POA: Diagnosis not present

## 2017-06-10 DIAGNOSIS — J019 Acute sinusitis, unspecified: Secondary | ICD-10-CM | POA: Diagnosis not present

## 2017-06-21 DIAGNOSIS — F411 Generalized anxiety disorder: Secondary | ICD-10-CM | POA: Diagnosis not present

## 2017-07-02 DIAGNOSIS — F411 Generalized anxiety disorder: Secondary | ICD-10-CM | POA: Diagnosis not present

## 2017-07-20 ENCOUNTER — Encounter: Payer: Self-pay | Admitting: Family Medicine

## 2017-07-20 ENCOUNTER — Ambulatory Visit: Payer: BLUE CROSS/BLUE SHIELD | Admitting: Family Medicine

## 2017-07-20 VITALS — BP 142/78 | HR 76 | Temp 97.9°F | Resp 14 | Ht 70.5 in | Wt 149.0 lb

## 2017-07-20 DIAGNOSIS — E162 Hypoglycemia, unspecified: Secondary | ICD-10-CM | POA: Diagnosis not present
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
## 2017-07-21 LAB — COMPLETE METABOLIC PANEL WITH GFR
AG Ratio: 2.1 (calc) (ref 1.0–2.5)
ALBUMIN MSPROF: 4.2 g/dL (ref 3.6–5.1)
ALT: 11 U/L (ref 9–46)
AST: 13 U/L (ref 10–40)
Alkaline phosphatase (APISO): 38 U/L — ABNORMAL LOW (ref 40–115)
BILIRUBIN TOTAL: 0.6 mg/dL (ref 0.2–1.2)
BUN: 17 mg/dL (ref 7–25)
CALCIUM: 9.3 mg/dL (ref 8.6–10.3)
CHLORIDE: 105 mmol/L (ref 98–110)
CO2: 29 mmol/L (ref 20–32)
CREATININE: 0.97 mg/dL (ref 0.60–1.35)
GFR, EST AFRICAN AMERICAN: 115 mL/min/{1.73_m2} (ref >=60)
GFR, EST NON AFRICAN AMERICAN: 99 mL/min/{1.73_m2} (ref >=60)
Globulin: 2 g/dL (calc) (ref 1.9–3.7)
Glucose, Bld: 96 mg/dL (ref 65–99)
Potassium: 4.3 mmol/L (ref 3.5–5.3)
Sodium: 141 mmol/L (ref 135–146)
TOTAL PROTEIN: 6.2 g/dL (ref 6.1–8.1)

## 2017-07-21 LAB — CBC WITH DIFFERENTIAL/PLATELET
Basophils Absolute: 52 cells/uL (ref 0–200)
Basophils Relative: 0.9 %
EOS PCT: 2.2 %
Eosinophils Absolute: 128 cells/uL (ref 15–500)
HEMATOCRIT: 44.4 % (ref 38.5–50.0)
HEMOGLOBIN: 14.4 g/dL (ref 13.2–17.1)
LYMPHS ABS: 2627 {cells}/uL (ref 850–3900)
MCH: 29.5 pg (ref 27.0–33.0)
MCHC: 32.4 g/dL (ref 32.0–36.0)
MCV: 91 fL (ref 80.0–100.0)
MONOS PCT: 6.7 %
MPV: 12.1 fL (ref 7.5–12.5)
NEUTROS ABS: 2604 {cells}/uL (ref 1500–7800)
NEUTROS PCT: 44.9 %
Platelets: 223 10*3/uL (ref 140–400)
RBC: 4.88 10*6/uL (ref 4.20–5.80)
RDW: 12 % (ref 11.0–15.0)
Total Lymphocyte: 45.3 %
WBC mixed population: 389 cells/uL (ref 200–950)
WBC: 5.8 10*3/uL (ref 3.8–10.8)

## 2017-07-21 LAB — HEMOGLOBIN A1C
EAG (MMOL/L): 5.7 (calc)
Hgb A1c MFr Bld: 5.2 % of total Hgb (ref <5.7)
Mean Plasma Glucose: 103 (calc)

## 2017-07-26 ENCOUNTER — Ambulatory Visit: Payer: BLUE CROSS/BLUE SHIELD | Admitting: Family Medicine

## 2017-07-26 ENCOUNTER — Other Ambulatory Visit: Payer: Self-pay

## 2017-07-26 ENCOUNTER — Encounter: Payer: Self-pay | Admitting: Family Medicine

## 2017-07-26 VITALS — BP 130/70 | HR 59 | Temp 98.1°F | Resp 16 | Ht 70.5 in | Wt 148.0 lb

## 2017-07-26 DIAGNOSIS — F411 Generalized anxiety disorder: Secondary | ICD-10-CM | POA: Diagnosis not present

## 2017-07-26 DIAGNOSIS — E162 Hypoglycemia, unspecified: Secondary | ICD-10-CM
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
## 2017-08-06 DIAGNOSIS — J069 Acute upper respiratory infection, unspecified: Secondary | ICD-10-CM | POA: Diagnosis not present

## 2017-08-17 DIAGNOSIS — F411 Generalized anxiety disorder: Secondary | ICD-10-CM | POA: Diagnosis not present

## 2017-09-16 DIAGNOSIS — F411 Generalized anxiety disorder: Secondary | ICD-10-CM | POA: Diagnosis not present

## 2017-10-18 ENCOUNTER — Ambulatory Visit: Payer: BLUE CROSS/BLUE SHIELD | Admitting: Family Medicine

## 2017-10-18 ENCOUNTER — Encounter: Payer: Self-pay | Admitting: Family Medicine

## 2017-10-18 VITALS — BP 100/64 | HR 78 | Temp 97.9°F | Resp 12 | Ht 70.5 in | Wt 146.0 lb

## 2017-10-18 DIAGNOSIS — J310 Chronic rhinitis: Secondary | ICD-10-CM

## 2017-10-18 DIAGNOSIS — J329 Chronic sinusitis, unspecified: Secondary | ICD-10-CM | POA: Diagnosis not present

## 2017-10-18 MED ORDER — AMOXICILLIN 875 MG PO TABS: 875.0000 mg | ORAL_TABLET | Freq: Two times a day (BID) | ORAL | 0 refills | Status: DC
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
## 2017-10-19 DIAGNOSIS — F411 Generalized anxiety disorder: Secondary | ICD-10-CM | POA: Diagnosis not present

## 2017-11-12 ENCOUNTER — Ambulatory Visit: Payer: BLUE CROSS/BLUE SHIELD | Admitting: Family Medicine

## 2017-11-12 ENCOUNTER — Encounter: Payer: Self-pay | Admitting: Family Medicine

## 2017-11-12 VITALS — BP 110/68 | HR 70 | Temp 97.9°F | Resp 12 | Ht 70.5 in | Wt 143.0 lb

## 2017-11-12 DIAGNOSIS — L819 Disorder of pigmentation, unspecified: Secondary | ICD-10-CM | POA: Diagnosis not present

## 2017-11-12 MED ORDER — TRETINOIN 0.1 % EX CREA: TOPICAL_CREAM | Freq: Every day | CUTANEOUS | 0 refills | Status: DC
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
## 2017-11-15 ENCOUNTER — Telehealth: Payer: Self-pay | Admitting: Family Medicine

## 2017-11-15 NOTE — Telephone Encounter (Signed)
PA Submitted through [email or web address] and received the following:  Your information has been submitted to [employer] [place]. [employer] [place] will review the request and fax you a determination directly, typically within 3 business days of your submission once all necessary information is received.  If [employer] [place] has not responded in 3 business days or if you have any questions about your submission, contact [employer] [place] at [phone]. 

## 2017-11-17 DIAGNOSIS — F411 Generalized anxiety disorder: Secondary | ICD-10-CM | POA: Diagnosis not present

## 2017-11-18 MED ORDER — TRETINOIN 0.1 % EX CREA: TOPICAL_CREAM | Freq: Every day | CUTANEOUS | 0 refills | Status: DC

## 2017-11-18 NOTE — Telephone Encounter (Signed)
Coverage denied - ins does not cover this type of medication for hyperpigmentation. Pharm made aware

## 2017-12-13 DIAGNOSIS — F411 Generalized anxiety disorder: Secondary | ICD-10-CM | POA: Diagnosis not present

## 2017-12-14 ENCOUNTER — Other Ambulatory Visit: Payer: Self-pay

## 2017-12-14 ENCOUNTER — Emergency Department (HOSPITAL_COMMUNITY)
Admission: EM | Admit: 2017-12-14 | Discharge: 2017-12-14 | Disposition: A | Discharge status: Home / Self Care | Payer: BLUE CROSS/BLUE SHIELD | Attending: Emergency Medicine | Admitting: Emergency Medicine

## 2017-12-14 ENCOUNTER — Encounter (HOSPITAL_COMMUNITY): Payer: Self-pay

## 2017-12-14 DIAGNOSIS — F419 Anxiety disorder, unspecified: Secondary | ICD-10-CM | POA: Insufficient documentation

## 2017-12-14 DIAGNOSIS — E860 Dehydration: Secondary | ICD-10-CM | POA: Diagnosis not present

## 2017-12-14 DIAGNOSIS — Z79899 Other long term (current) drug therapy: Secondary | ICD-10-CM | POA: Diagnosis not present

## 2017-12-14 DIAGNOSIS — R5383 Other fatigue: Secondary | ICD-10-CM | POA: Diagnosis not present

## 2017-12-14 DIAGNOSIS — F329 Major depressive disorder, single episode, unspecified: Secondary | ICD-10-CM | POA: Insufficient documentation

## 2017-12-14 LAB — COMPREHENSIVE METABOLIC PANEL
ALBUMIN: 4.5 g/dL (ref 3.5–5.0)
ALT: 16 U/L (ref 0–44)
ANION GAP: 8 (ref 5–15)
AST: 21 U/L (ref 15–41)
Alkaline Phosphatase: 41 U/L (ref 38–126)
BILIRUBIN TOTAL: 0.5 mg/dL (ref 0.3–1.2)
BUN: 27 mg/dL — ABNORMAL HIGH (ref 6–20)
CO2: 28 mmol/L (ref 22–32)
Calcium: 9.2 mg/dL (ref 8.9–10.3)
Chloride: 105 mmol/L (ref 98–111)
Creatinine, Ser: 1.24 mg/dL (ref 0.61–1.24)
Glucose, Bld: 95 mg/dL (ref 70–99)
POTASSIUM: 4.1 mmol/L (ref 3.5–5.1)
Sodium: 141 mmol/L (ref 135–145)
TOTAL PROTEIN: 7.4 g/dL (ref 6.5–8.1)

## 2017-12-14 LAB — CBC WITH DIFFERENTIAL/PLATELET
BASOS ABS: 0 10*3/uL (ref 0.0–0.1)
Basophils Relative: 0 %
EOS PCT: 1 %
Eosinophils Absolute: 0 10*3/uL (ref 0.0–0.7)
HCT: 45.9 % (ref 39.0–52.0)
Hemoglobin: 15.5 g/dL (ref 13.0–17.0)
Lymphocytes Relative: 24 %
Lymphs Abs: 1.8 10*3/uL (ref 0.7–4.0)
MCH: 30.9 pg (ref 26.0–34.0)
MCHC: 33.8 g/dL (ref 30.0–36.0)
MCV: 91.4 fL (ref 78.0–100.0)
MONO ABS: 0.4 10*3/uL (ref 0.1–1.0)
Monocytes Relative: 5 %
Neutro Abs: 5.4 10*3/uL (ref 1.7–7.7)
Neutrophils Relative %: 70 %
PLATELETS: 239 10*3/uL (ref 150–400)
RBC: 5.02 MIL/uL (ref 4.22–5.81)
RDW: 12.6 % (ref 11.5–15.5)
WBC: 7.6 10*3/uL (ref 4.0–10.5)

## 2017-12-14 LAB — CK: Total CK: 161 U/L (ref 49–397)

## 2017-12-14 MED ORDER — SODIUM CHLORIDE 0.9 % IV BOLUS
1000.0000 mL | Freq: Once | INTRAVENOUS | Status: AC
  Administered 2017-12-14: 1000 mL via INTRAVENOUS

## 2017-12-14 MED ORDER — SODIUM CHLORIDE 0.9 % IV BOLUS
1000.0000 mL | Freq: Once | INTRAVENOUS | Status: AC
Start: 2017-12-14 — End: 2017-12-14
  Administered 2017-12-14: 1000 mL via INTRAVENOUS

## 2017-12-14 NOTE — ED Triage Notes (Signed)
Pt is alert and oriented x 4 and is verbally responsive. Pt reports that I am here for  " heat exhaustion" Pt reports that he cant think and is fatigue and has been working out in the heat the past day reports HA and leg cramping. Pt reports that he has a hx of heat exhaustion. Pt is accompained with mother.

## 2017-12-14 NOTE — ED Notes (Signed)
Family at bedside. 
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
## 2017-12-14 NOTE — Discharge Instructions (Addendum)
Please follow up with your primary care provider within 5-7 days for re-evaluation of your symptoms. If you do not have a primary care provider, information for a healthcare clinic has been provided for you to make arrangements for follow up care. Please return to the emergency department for any new or worsening symptoms. ° °

## 2017-12-14 NOTE — ED Notes (Signed)
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  He went in his truck but it was not cool enough for him.  He reports he felt like he was delirious and was responding slow.  He denies any pain, or feeling week or lightheaded at the time.  Pt is A&Ox 4.

## 2018-01-10 DIAGNOSIS — F411 Generalized anxiety disorder: Secondary | ICD-10-CM | POA: Diagnosis not present

## 2018-02-08 DIAGNOSIS — F411 Generalized anxiety disorder: Secondary | ICD-10-CM | POA: Diagnosis not present

## 2018-02-10 ENCOUNTER — Encounter (HOSPITAL_COMMUNITY): Payer: Self-pay | Admitting: Emergency Medicine

## 2018-02-10 ENCOUNTER — Emergency Department (HOSPITAL_COMMUNITY)
Admission: EM | Admit: 2018-02-10 | Discharge: 2018-02-10 | Disposition: A | Discharge status: Home / Self Care | Payer: BLUE CROSS/BLUE SHIELD | Attending: Emergency Medicine | Admitting: Emergency Medicine

## 2018-02-10 ENCOUNTER — Other Ambulatory Visit: Payer: Self-pay

## 2018-02-10 DIAGNOSIS — Z79899 Other long term (current) drug therapy: Secondary | ICD-10-CM | POA: Insufficient documentation

## 2018-02-10 DIAGNOSIS — T63441A Toxic effect of venom of bees, accidental (unintentional), initial encounter: Secondary | ICD-10-CM | POA: Diagnosis not present

## 2018-02-10 DIAGNOSIS — F329 Major depressive disorder, single episode, unspecified: Secondary | ICD-10-CM | POA: Diagnosis not present

## 2018-02-10 DIAGNOSIS — F419 Anxiety disorder, unspecified: Secondary | ICD-10-CM | POA: Insufficient documentation

## 2018-02-10 MED ORDER — METHYLPREDNISOLONE SODIUM SUCC 125 MG IJ SOLR
80.0000 mg | Freq: Once | INTRAMUSCULAR | Status: AC
Start: 2018-02-10 — End: 2018-02-10
  Administered 2018-02-10: 80 mg via INTRAMUSCULAR
  Filled 2018-02-10: qty 2

## 2018-02-10 MED ORDER — EPINEPHRINE 0.3 MG/0.3ML IJ SOAJ: 0.3000 mg | Freq: Once | INTRAMUSCULAR | 0 refills | Status: AC

## 2018-02-10 MED ORDER — PREDNISONE 10 MG (21) PO TBPK: ORAL_TABLET | Freq: Every day | ORAL | 0 refills | Status: DC

## 2018-02-10 MED ORDER — METHYLPREDNISOLONE SODIUM SUCC 125 MG IJ SOLR: 80.0000 mg | Freq: Once | INTRAMUSCULAR | Status: DC

## 2018-02-10 MED ORDER — DIPHENHYDRAMINE HCL 25 MG PO CAPS
50.0000 mg | ORAL_CAPSULE | Freq: Once | ORAL | Status: AC
  Administered 2018-02-10: 50 mg via ORAL
  Filled 2018-02-10: qty 2
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
## 2018-02-10 NOTE — Discharge Instructions (Signed)
Take the steroids as directed on your discharge paperwork.  Please monitor your symptoms and if you experience any lip swelling, tongue swelling, throat swelling, difficulty swallowing or difficulty breathing then you need to use your EpiPen and return to the emergency department immediately for evaluation.

## 2018-02-10 NOTE — ED Notes (Signed)
Pt verbalized understanding of discharge instructions and denies any further questions at this time.     Pt on med wait.

## 2018-02-10 NOTE — ED Triage Notes (Signed)
Pt reports being stung by a yellow jacket in the back of the head. Speaking clear sentences and denies throat or tongue sensitivity. VSS/ A/O X4

## 2018-02-10 NOTE — ED Notes (Signed)
ED Provider at bedside. 

## 2018-03-08 DIAGNOSIS — F411 Generalized anxiety disorder: Secondary | ICD-10-CM | POA: Diagnosis not present

## 2018-03-16 ENCOUNTER — Ambulatory Visit: Payer: BLUE CROSS/BLUE SHIELD | Admitting: Psychiatry

## 2018-03-16 ENCOUNTER — Ambulatory Visit (INDEPENDENT_AMBULATORY_CARE_PROVIDER_SITE_OTHER): Payer: BLUE CROSS/BLUE SHIELD | Admitting: Psychiatry

## 2018-03-16 ENCOUNTER — Telehealth: Payer: Self-pay | Admitting: Psychiatry

## 2018-03-16 DIAGNOSIS — F411 Generalized anxiety disorder: Secondary | ICD-10-CM | POA: Diagnosis not present

## 2018-03-16 NOTE — Telephone Encounter (Signed)
FRED, Barnie CALLED BACK AND HE IS GOING TO COME TODAY AT 4PM

## 2018-03-16 NOTE — Telephone Encounter (Signed)
Thanks

## 2018-03-17 NOTE — Progress Notes (Signed)
      [facility] Counselor/Therapist Progress Note   Patient ID: Michael Powers, MRN: [id number]  Date: 03/17/2018  Timespent: 55 minutes  Treatment Type: Individual  Subjective: "My anxiety has been out of control.  My daughter has also been out of control.  When she returns from her mom's she is in terrible shape.  I have learned that being at dad is what I have been missing.  I am still angry about my neighbor who overstepped his boundaries with my girlfriend." The client is angry with his ex girlfriend, his daughter's mom, because of her poor parenting.  The client also realizes that he had made his current girlfriend take care of his daughter and ignored her needs.  He realizes that he was a poor boyfriend.  That, "I do not take the time to pay attention".  Client realizes that he needs to focus his attention on his daughter and his girl friend as well.  The client is rethinking his decision to move out of his house due to his neighbors behavior towards his girlfriend.  He still feels uncomfortable that his neighbor is Brewing technologist and has access to guns.  We discussed that he needs to approach this very carefully.  Went back over boundaries as well as legal actions he can take.  I used EMDR with client to reduce his overall anxiety from a 7+ to less than 1 at the end of the session.  His positive cognition was "I need to make it about my daughter."  Interventions:Solution Focused, Psychosocial Skills: Boundaries, Reframing and Other: EMDR  Mental Status Exam:   Appearance:   Casual     Behavior:  Appropriate  Motor:  Normal  Speech/Language:   Clear and Coherent  Affect:  Appropriate  Mood:  anxious  Thought process:  Coherent  Thought content:    Logical  Perceptual disturbances:    Normal  Orientation:  Full (Time, Place, and Person)  Attention:  Good  Concentration:  good  Memory:  Immediate  Fund of knowledge:   Good  Insight:    Good  Judgment:   Good  Impulse Control:   good    Reported Symptoms: Anxiety  Risk Assessment: Danger to Self:  No Self-injurious Behavior: No Danger to Others: No Duty to Warn:no Physical Aggression / Violence:No  Access to Firearms a concern: No  Gang Involvement:No   Diagnosis:   ICD-10-CM   1. Generalized anxiety disorder F41.1      Plan: Positive self talk, boundaries, assertiveness.  [name] Mia [place], [place]

## 2018-03-22 ENCOUNTER — Ambulatory Visit: Payer: Self-pay | Admitting: Psychiatry

## 2018-03-29 ENCOUNTER — Ambulatory Visit: Payer: BLUE CROSS/BLUE SHIELD | Admitting: Psychiatry

## 2018-03-29 DIAGNOSIS — F411 Generalized anxiety disorder: Secondary | ICD-10-CM

## 2018-03-29 NOTE — Progress Notes (Signed)
      [facility] Counselor/Therapist Progress Note   Patient ID: Michael Powers, MRN: [id number]  Date: 03/29/2018  Timespent: 60 minutes  Treatment Type: Individual  Subjective: The client reports that things have gone much better with his daughter.  As he has become more proactive she has responded much more positively. 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  We discussed boundaries in detail concerning his daughter, his employees, and his girlfriend.  I reminded the client that all the people that he is dealing with except for his daughter are adults.  It is not his job to [employer] them or controlled or lives.  He agreed especially when he thought about people telling him to do what he is asking others.  The client is gaining good insight and developing better skills in managing his life day today.  Used EMDR to reinforce these concepts and reduce his anxiety/irritabilty from a SUDS of 5+ to less than 1 at the end of session.  Interventions:Solution Focused, Conservator, museum/gallery, Psychosocial Skills: Boundaries, Supportive and Other: EMDR  Mental Status Exam:   Appearance:   Casual     Behavior:  Appropriate  Motor:  Normal  Speech/Language:   Clear and Coherent  Affect:  Appropriate  Mood:  anxious and irritable  Thought process:  Coherent  Thought content:    Logical  Perceptual disturbances:    Normal  Orientation:  Full (Time, Place, and Person)  Attention:  Good  Concentration:  good  Memory:  Immediate  Fund of knowledge:   Good  Insight:    Good  Judgment:   Good  Impulse Control:  good    Reported Symptoms: Anxiety, irritability  Risk Assessment: Danger to Self:  No Self-injurious Behavior: No Danger to Others: No Duty to Warn:no Physical Aggression / Violence:No  Access to Firearms a concern: No  Gang Involvement:No   Diagnosis:   ICD-10-CM   1. Generalized anxiety disorder F41.1       Plan: Boundaries, respect, assertiveness.  Michael Powers Michael Powers, [place]

## 2018-04-28 ENCOUNTER — Ambulatory Visit: Payer: BLUE CROSS/BLUE SHIELD | Admitting: Psychiatry

## 2018-04-28 ENCOUNTER — Encounter: Payer: Self-pay | Admitting: Psychiatry

## 2018-04-28 DIAGNOSIS — F411 Generalized anxiety disorder: Secondary | ICD-10-CM

## 2018-04-28 NOTE — Progress Notes (Signed)
      [facility] Counselor/Therapist Progress Note   Patient ID: Michael Powers, MRN: [id number]  Date: 04/28/2018  Timespent: 58 minutes   Treatment Type: Individual   Reported Symptoms: frustration, anxiety   Mental Status Exam:    Appearance:   Casual     Behavior:  Appropriate  Motor:  Normal  Speech/Language:   Clear and Coherent  Affect:  Appropriate  Mood:  anxious and irritable  Thought process:  normal  Thought content:    WNL  Sensory/Perceptual disturbances:    WNL  Orientation:  oriented to person, place, time/date and situation  Attention:  Good  Concentration:  Good  Memory:  WNL  Fund of knowledge:   Good  Insight:    Good  Judgment:   Good  Impulse Control:  Good     Risk Assessment: Danger to Self:  No Self-injurious Behavior: No Danger to Others: No Duty to Warn:no Physical Aggression / Violence:No  Access to Firearms a concern: No  Gang Involvement:No    Subjective: The client reports that his relationship with his daughter is much better.  He did note that when his girlfriend was over that his daughter would revert back to her old behaviors.  Her primary care physician had asked for mental health evaluation concerning his daughter's attention deficit hyperactivity disorder.  The client is concerned about putting his daughter on medication.  I discussed with the client that it is a balancing act between medication and behaviors.  However is doing the testing should be helpful for them in developing skills that his daughter can use to manage her attention issues. The client realizes that his relationship with his current girlfriend is not healthy.  In retrospect he realized that he depended too much on her to clean take care of his daughter so he could be freed up to work.  He took her for granted and assumed all was well.  We discussed that his tendency to choose relationships with the other person needs to be rescued always leads to difficulties.   He notes that even currently he and his girlfriend do not have a lot to talk about although he does say they have a good sexual relationship.  I explained to the client that a good sexual relationship does not always mean a good relationship.  There are many other factors that go into a relationship that we will cover at the next session. I used eye movement desensitization and reprocessing with the client to address the issues with his girlfriend and feelings of failure.  As the client processed he realized that it was not all him but that she had a part to play as well. The client also realized that the same dynamics that impact his relationship with his girlfriend and also applied to his employees.  They can tend to take advantage of him expecting privileges without doing any work.  Ultimately this makes the client furious which then he reacts in ways that are not ultimately helpful.  He will continue to work on setting boundaries and possibly even developing an Engineer, materialsemployees handbook.   Interventions: Assertiveness/Communication, Solution-Oriented/Positive Psychology, Eye Movement Desensitization and Reprocessing (EMDR) and Insight-Oriented   Diagnosis:   ICD-10-CM   1. Generalized anxiety disorder F41.1      Plan: Boundaries, employee handbook, self care.   Gelene MinkFrederick Tayana Shankle, [place]

## 2018-05-21 IMAGING — CT CT HEAD W/O CM
3 of 4 series · 15 of 47 positions shown, 18 images · non-contrast
Comparison: 04/04/2014

CLINICAL DATA: Headache and cramping after working in the sun
yesterday with possible dehydration.

EXAM:
CT HEAD WITHOUT CONTRAST
TECHNIQUE: Contiguous axial images were obtained from the base of the skull
through the vertex without intravenous contrast.

[Series 2: head w/o · axial · non-contrast · 0.45mm/px · z∈[-125,-5]mm · 9 of 32 slices shown, 12 images]
[im 4/32  brain]
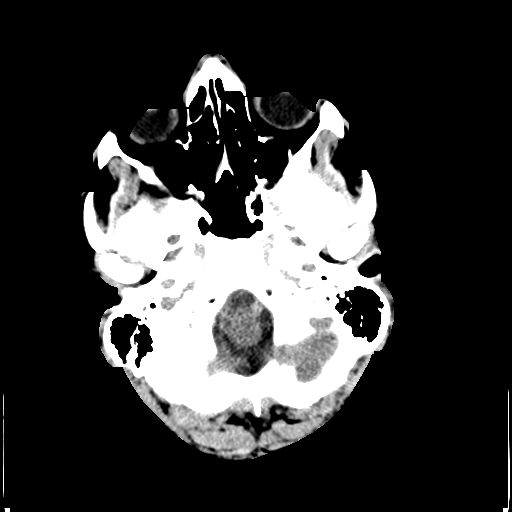
[im 4/32  bone]
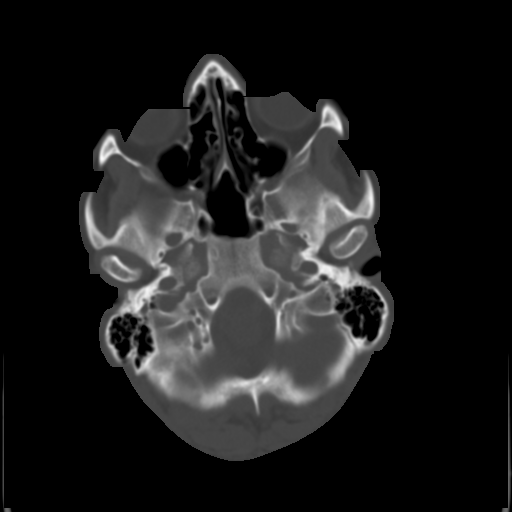
[im 7/32  brain]
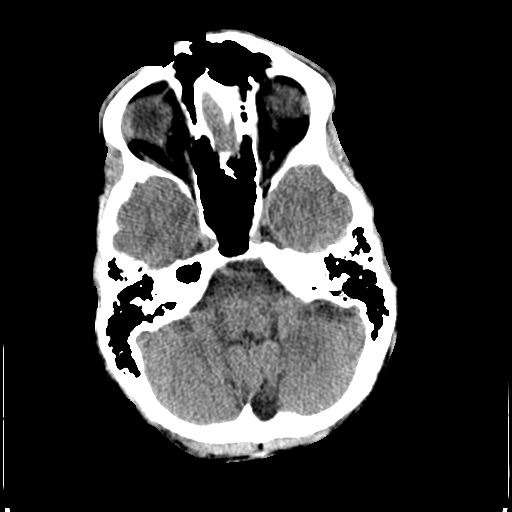
[im 10/32  brain]
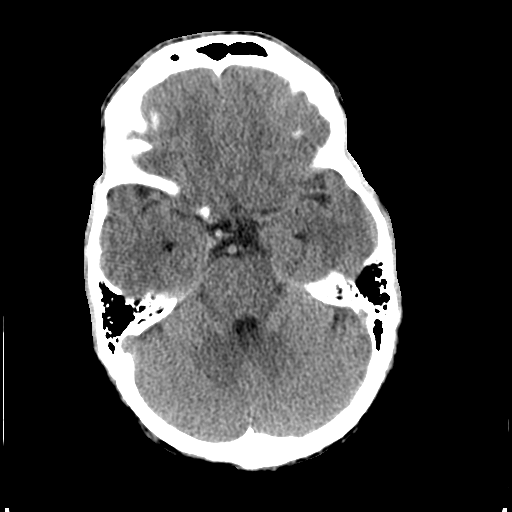
[im 13/32  brain]
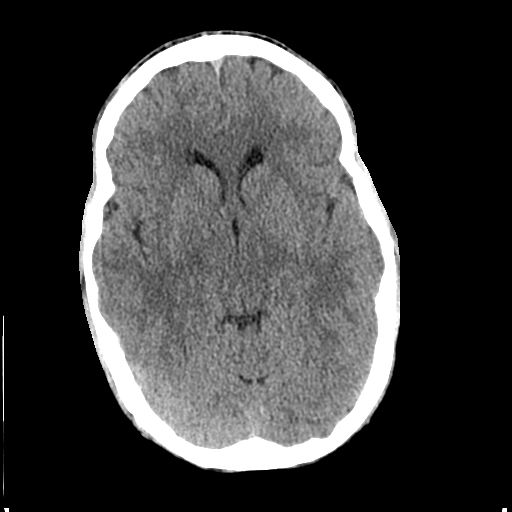
[im 16/32  brain]
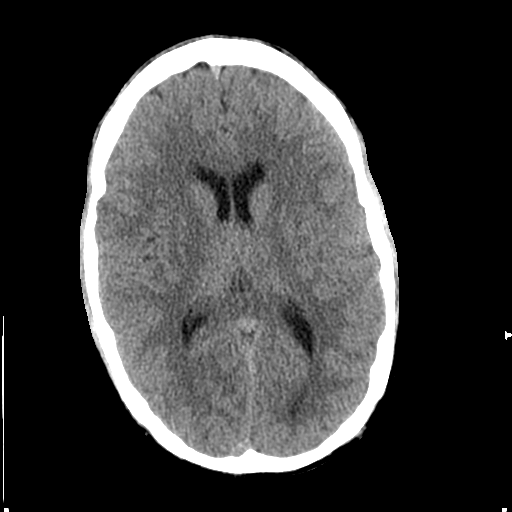
[im 16/32  bone]
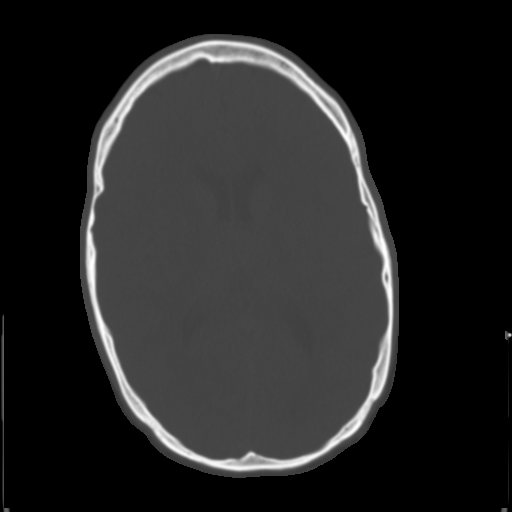
[im 19/32  brain]
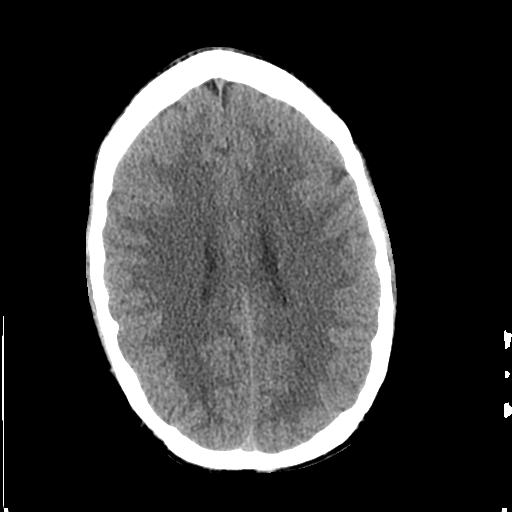
[im 22/32  brain]
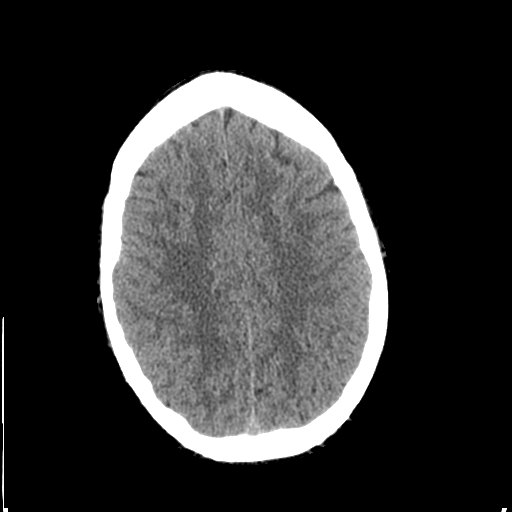
[im 25/32  brain]
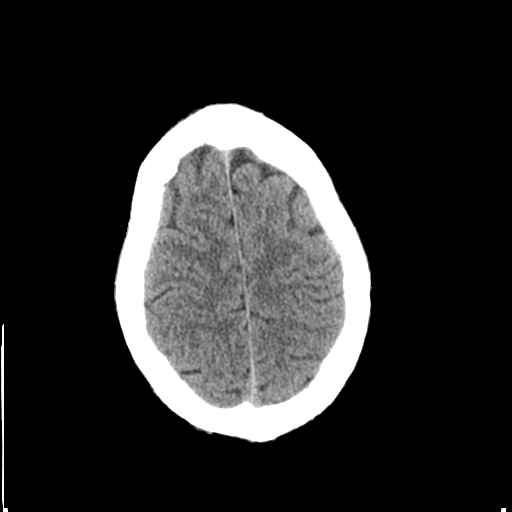
[im 28/32  brain]
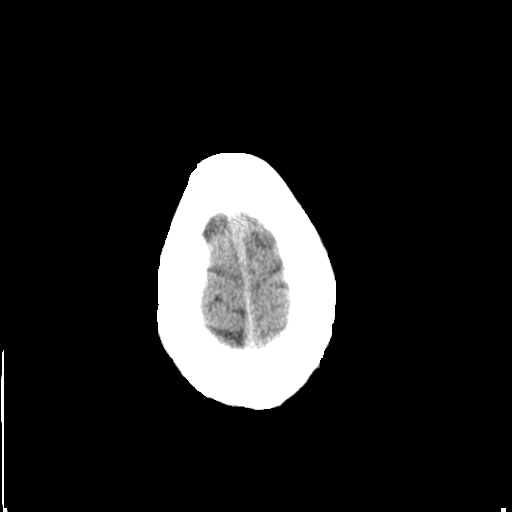
[im 28/32  bone]
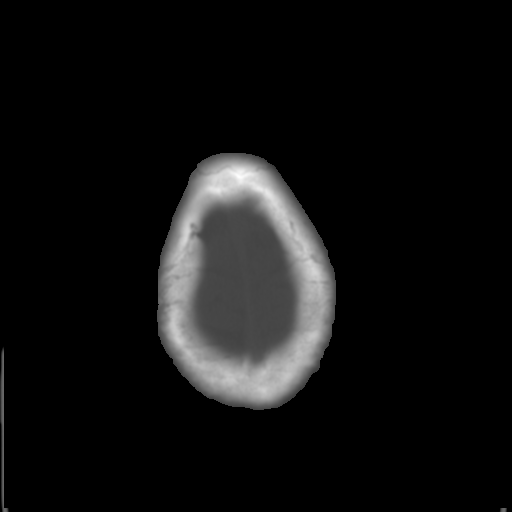

[Series 5: coronal · coronal · 0.31mm/px · 3 of 75 slices shown]
[im 25/75  brain]
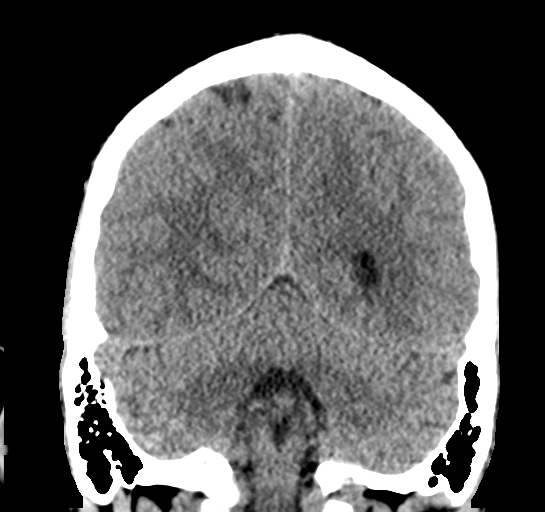
[im 33/75  brain]
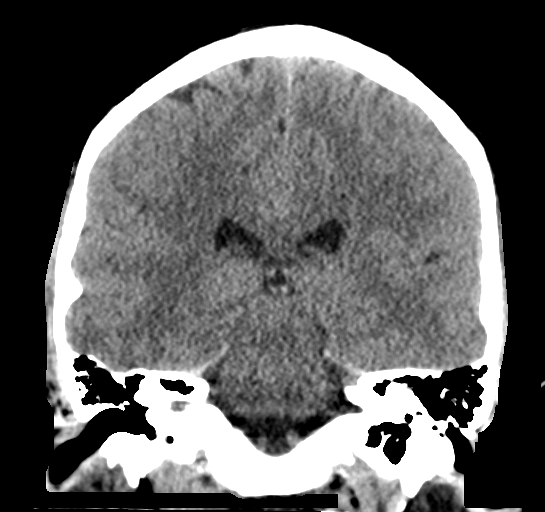
[im 42/75  brain]
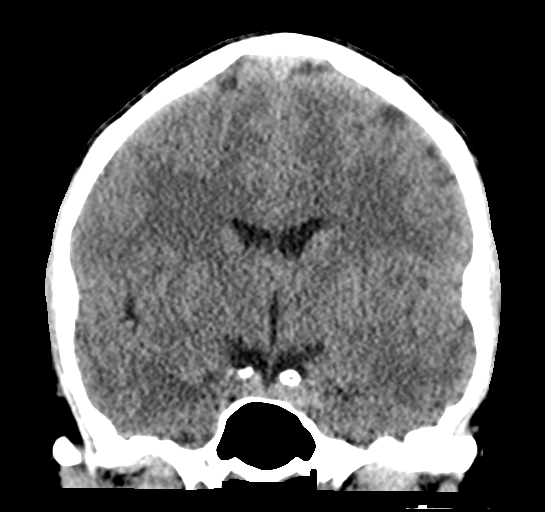

[Series 6: sagittal · sagittal · 0.32mm/px · 3 of 56 slices shown]
[im 19/56  brain]
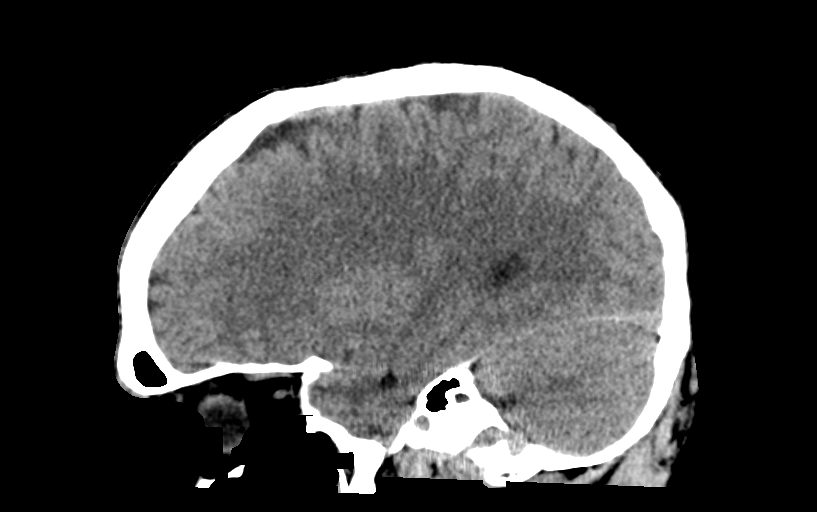
[im 28/56  brain]
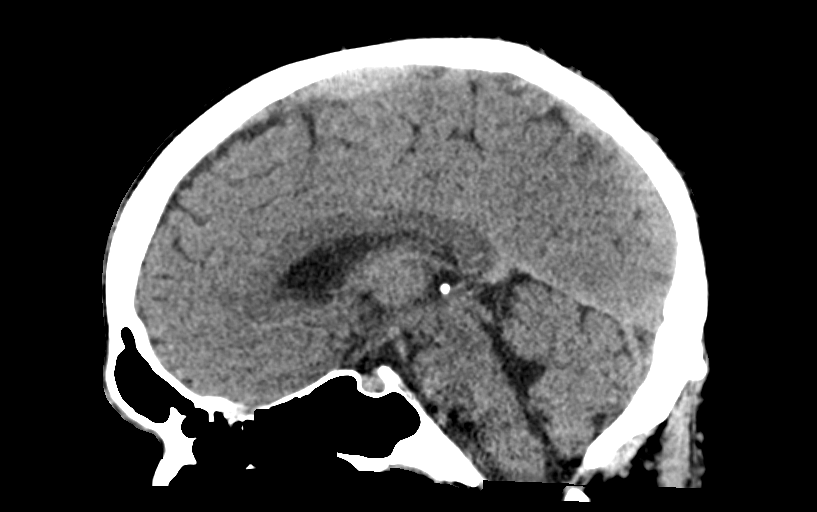
[im 37/56  brain]
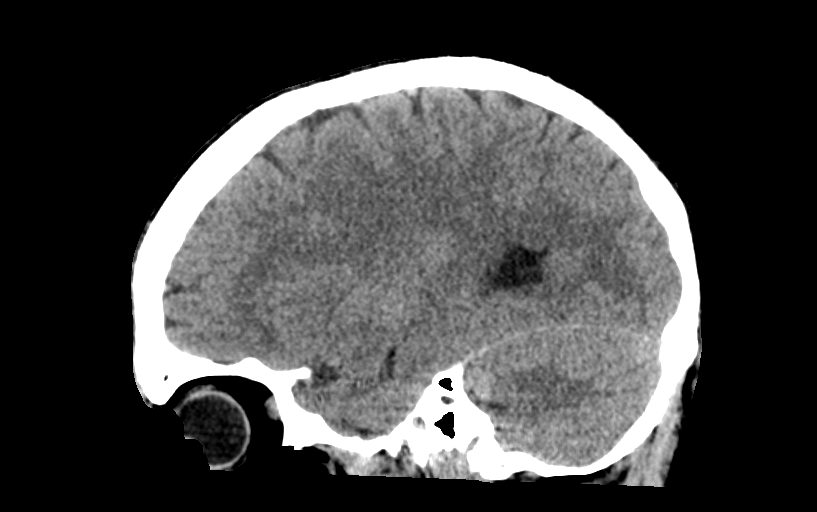

[15 of 47 positions shown; findings below may reference images not displayed]

FINDINGS: Ventricles and sulci appear symmetrical. No ventricular dilatation.
No mass effect or midline shift. No abnormal extra-axial fluid
collections. Gray-white matter junctions are distinct. Basal
cisterns are not effaced. No evidence of acute intracranial
hemorrhage. No depressed skull fractures. Minimal mucosal thickening
in the maxillary antra. Mastoid air cells are not opacified.
IMPRESSION: No acute intracranial abnormalities.

## 2018-05-23 ENCOUNTER — Encounter: Payer: Self-pay | Admitting: Psychiatry

## 2018-05-23 ENCOUNTER — Ambulatory Visit: Payer: BLUE CROSS/BLUE SHIELD | Admitting: Psychiatry

## 2018-05-23 DIAGNOSIS — F411 Generalized anxiety disorder: Secondary | ICD-10-CM

## 2018-05-23 NOTE — Progress Notes (Signed)
      [facility] Counselor/Therapist Progress Note  Patient ID: Michael Powers, MRN: [id number],    Date: 05/23/2018  Time Spent: 58 minutes   Treatment Type: Individual Therapy  Reported Symptoms: Anxious Mood  Mental Status Exam:  Appearance:   Casual     Behavior:  Appropriate  Motor:  Normal  Speech/Language:   Clear and Coherent  Affect:  Appropriate  Mood:  anxious and irritable  Thought process:  normal  Thought content:    WNL  Sensory/Perceptual disturbances:    WNL  Orientation:  oriented to person, place, time/date and situation  Attention:  Good  Concentration:  Good  Memory:  WNL  Fund of knowledge:   Good  Insight:    Good  Judgment:   Good  Impulse Control:  Good   Risk Assessment: Danger to Self:  No Self-injurious Behavior: No Danger to Others: No Duty to Warn:no Physical Aggression / Violence:No  Access to Firearms a concern: No  Gang Involvement:No   Subjective: 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  He ended up laying to men off.  He states, "it solve my problem." He recently traveled to [place] over the Thanksgiving holiday with his girlfriend.  He felt like it went well.  His daughter has been much better but he has not followed up on her ADHD testing.  The client realizes that he needs to set goals to work on.  He would like to get married but he is concerned about his girlfriend that she cannot take care of herself.  She has had 4 different circumstances where when she has drank too much, men took advantage of her and had sex with her.  She seems to be getting better recently especially now that she has been taking Lexapro.  He decided he will have to walk it out to see how things go in the next few months.  The clients father just disclosed that his cancer has returned.  The client is clearly upset by this.  He will continue to engage his father and try to have more meaningful conversations.  I used EMDR with the  client around the issues with his girlfriend.  His negative cognition was, "can I forgive my girlfriend?"  As the client processed he was able to get to a place where he felt like he could.  He realized he would just need to walk it out.  Interventions: Assertiveness/Communication, Solution-Oriented/Positive Psychology, Eye Movement Desensitization and Reprocessing (EMDR) and Insight-Oriented  Diagnosis:   ICD-10-CM   1. Generalized anxiety disorder F41.1     Plan: Acceptance, boundaries, assertiveness.  Gelene MinkFrederick Ilyas Lipsitz, [place]

## 2018-05-31 ENCOUNTER — Encounter: Payer: Self-pay | Admitting: Psychiatry

## 2018-05-31 ENCOUNTER — Ambulatory Visit (INDEPENDENT_AMBULATORY_CARE_PROVIDER_SITE_OTHER): Payer: BLUE CROSS/BLUE SHIELD | Admitting: Psychiatry

## 2018-05-31 DIAGNOSIS — F411 Generalized anxiety disorder: Secondary | ICD-10-CM

## 2018-05-31 NOTE — Progress Notes (Signed)
      [facility] Counselor/Therapist Progress Note  Patient ID: Michael CoombsJames L Garson, MRN: [id number],    Date: 05/31/2018   Time Spent: 58 minutes   Treatment Type: Individual Therapy  Reported Symptoms: Anxious Mood  Mental Status Exam:  Appearance:   Casual and Well Groomed     Behavior:  Appropriate  Motor:  Normal  Speech/Language:   Clear and Coherent  Affect:  Appropriate  Mood:  anxious  Thought process:  normal  Thought content:    WNL  Sensory/Perceptual disturbances:    WNL  Orientation:  oriented to person, place, time/date and situation  Attention:  Good  Concentration:  Good  Memory:  WNL  Fund of knowledge:   Good  Insight:    Good  Judgment:   Good  Impulse Control:  Good   Risk Assessment: Danger to Self:  No Self-injurious Behavior: No Danger to Others: No Duty to Warn:no Physical Aggression / Violence:No  Access to Firearms a concern: No  Gang Involvement:No   Subjective: The client had just finished a long weekend with his daughter.  He states, "I found myself getting irritated.  Is it me?"  As we discussed this at length the client admitted that he has an issue with perfectionism that he learned from his dad.  When people do not measure up to his expectations he becomes angry.  Or if things feel out of control he gets very anxious.  We talked about the client trying to be more intentional with his behavior and thinking about if what he wants is reasonable. 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  Only 1 of the 4 showed up which upset the client.  The client again had probably brought more food than was necessary for the number of people that he had.  He also noted that his girlfriend was not at this cookout.  His anxiety was much less.  We went on to discuss the issues that he has with his girlfriend.  I asked the client if he could tolerate some of her idiosyncrasies.  She also has a different personality and does not always do what he  expects.  This is  all part of the bigger picture about his expectations.  The client agrees that he needs to resolve these issues not only with his girlfriend but with his daughter as well he did come to a conclusion, "it is not a perfect world, so do not expect perfection."  I used EMDR with the client to process through the anxiety that he felt concerning both his girlfriend and his daughter.  His subjective units of distress at the beginning was a 5 and ended at less than 1.  Interventions: Assertiveness/Communication, Solution-Oriented/Positive Psychology, Eye Movement Desensitization and Reprocessing (EMDR) and Insight-Oriented  Diagnosis:   ICD-10-CM   1. Generalized anxiety disorder F41.1     Plan: Assertive communication, boundaries, self-care, positive self talk.  Gelene MinkFrederick Ashey Tramontana, [place]

## 2018-06-30 ENCOUNTER — Ambulatory Visit (INDEPENDENT_AMBULATORY_CARE_PROVIDER_SITE_OTHER): Payer: BLUE CROSS/BLUE SHIELD | Admitting: Psychiatry

## 2018-06-30 ENCOUNTER — Encounter: Payer: Self-pay | Admitting: Psychiatry

## 2018-06-30 DIAGNOSIS — F411 Generalized anxiety disorder: Secondary | ICD-10-CM | POA: Diagnosis not present

## 2018-06-30 NOTE — Progress Notes (Signed)
      [facility] Counselor/Therapist Progress Note  Patient ID: Michael Powers, MRN: [id number],    Date: 06/30/2018   Time Spent: 49 minutes   Treatment Type: Individual Therapy  Reported Symptoms: Anxious Mood  Mental Status Exam:  Appearance:   Casual     Behavior:  Appropriate  Motor:  Normal  Speech/Language:   Clear and Coherent  Affect:  Appropriate  Mood:  anxious  Thought process:  normal  Thought content:    WNL  Sensory/Perceptual disturbances:    WNL  Orientation:  oriented to person, place, time/date and situation  Attention:  Good  Concentration:  Good  Memory:  WNL  Fund of knowledge:   Good  Insight:    Good  Judgment:   Good  Impulse Control:  Good   Risk Assessment: Danger to Self:  No Self-injurious Behavior: No Danger to Others: No Duty to Warn:no Physical Aggression / Violence:No  Access to Firearms a concern: No  Gang Involvement:No   Subjective: The client states that he has been very rushed.  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  He has had difficulty setting boundaries with the men that work for him.  They are inconsistent, use drugs and typically are in trouble with the law.  We discussed where else he could draw on people to work for him that would be of a better caliber and more reliable. The client is also worried about his daughter.  She received a smart phone for Christmas from her grandfather.  The client's daughter is 19 years old.  He is very concerned about who she is texting and what she is doing on the phone.  We discussed the difference between authoritative and authoritarian parenting he is afraid to lose his daughters love that she will just run back to her mother.  We discussed that his job as a parent is to train his daughter not to be her best friend.  The client is worried about what he cannot control. I used the EMDR paddles with the client as he discussed all these things  that he is anxious about.  He was able to reduce his level of anxiety from a subjective units of distress of 5 to less than 2 at the end of the session.  The client will work at being more authoritative with his daughter.  He will also search out other opportunities to hire new people that can be more reliable at his work.  Interventions: Assertiveness/Communication, Solution-Oriented/Positive Psychology, Eye Movement Desensitization and Reprocessing (EMDR) and Insight-Oriented  Diagnosis:   ICD-10-CM   1. Generalized anxiety disorder F41.1     Plan: Boundaries, assertiveness, mood independent behavior.  [name] Dailen Mcclish, [place]

## 2018-07-10 DIAGNOSIS — B349 Viral infection, unspecified: Secondary | ICD-10-CM | POA: Diagnosis not present

## 2018-07-12 DIAGNOSIS — B349 Viral infection, unspecified: Secondary | ICD-10-CM | POA: Diagnosis not present

## 2018-07-14 ENCOUNTER — Ambulatory Visit: Payer: Self-pay | Admitting: Family Medicine

## 2018-07-25 ENCOUNTER — Ambulatory Visit (INDEPENDENT_AMBULATORY_CARE_PROVIDER_SITE_OTHER): Payer: BLUE CROSS/BLUE SHIELD | Admitting: Psychiatry

## 2018-07-25 ENCOUNTER — Encounter: Payer: Self-pay | Admitting: Psychiatry

## 2018-07-25 ENCOUNTER — Ambulatory Visit: Payer: BLUE CROSS/BLUE SHIELD | Admitting: Psychiatry

## 2018-07-25 DIAGNOSIS — F411 Generalized anxiety disorder: Secondary | ICD-10-CM | POA: Diagnosis not present

## 2018-07-25 NOTE — Progress Notes (Signed)
      [facility] Counselor/Therapist Progress Note  Patient ID: Michael Powers, MRN: [id number],    Date: 07/25/2018  Time Spent: 58 minutes   Treatment Type: Individual  Reported Symptoms: Anxious Mood  Mental Status Exam:  Appearance:   Casual     Behavior:  Appropriate  Motor:  Normal  Speech/Language:   Clear and Coherent  Affect:  Appropriate  Mood:  anxious  Thought process:  normal  Thought content:    WNL  Sensory/Perceptual disturbances:    WNL  Orientation:  oriented to person, place, time/date and situation  Attention:  Good  Concentration:  Good  Memory:  WNL  Fund of knowledge:   Good  Insight:    Good  Judgment:   Good  Impulse Control:  Good   Risk Assessment: Danger to Self:  No Self-injurious Behavior: No Danger to Others: No Duty to Warn:no Physical Aggression / Violence:No  Access to Firearms a concern: No  Gang Involvement:No   Subjective: The client comes in today stating he has tightness in his chest.  His anxiety is rated at about an 8+.  We started in with some eye movement. The client went quickly from anxiety to anger.  "I am mad at everyone."  His subjective units of distress dropped to 6.  He discussed his girlfriend at length.  He stated she had made some comments that, "stuck with me.  I get tired of her attitude".  Her response is that he never helps her and only thinks about himself.  She recently bought cigarettes at a [employer] mental [facility] store.  The clients father walked in and saw her buying cigarettes and asked her about it.  She denied they were for her.  She has been avoidant.  On the basis of that the client states, "I cannot trust her." As we continued to discuss his relationship with his girlfriend I pointed out to the client that he was being very controlling.  He is not supportive of the decisions that she wants to make.  When she says stuff to please him and then he finds out it is not true he states he cannot trust her.  I explained  to the client that maybe she was trying to be a people pleaser to him.  He did note that he has heard from other people how competent and capable his girlfriend is.  This caused him to rethink how he approached her.  I pointed out to the client that the behavior that he is doing towards his girlfriend is exactly what his dad did to him.  This seemed to strike home and he made a decision to begin to change his behavior. His subjective units of distress is now below 2. The client reports that his daughter's mom just found out she is pregnant.  It is not going well for her because the mom's boyfriend is angry.  This is causing difficulties for his daughter.  The client is taking a wait-and-see attitude.  At the end of the session his positive cognition was, "I have got this."  Interventions: Assertiveness/Communication, Solution-Oriented/Positive Psychology, Eye Movement Desensitization and Reprocessing (EMDR) and Insight-Oriented  Diagnosis:   ICD-10-CM   1. Generalized anxiety disorder F41.1     Plan: Boundaries, assertiveness, self-care, acceptance.  Michael Powers, [place]

## 2018-08-24 ENCOUNTER — Encounter: Payer: Self-pay | Admitting: Psychiatry

## 2018-08-24 ENCOUNTER — Other Ambulatory Visit: Payer: Self-pay

## 2018-08-24 ENCOUNTER — Ambulatory Visit (INDEPENDENT_AMBULATORY_CARE_PROVIDER_SITE_OTHER): Payer: BLUE CROSS/BLUE SHIELD | Admitting: Psychiatry

## 2018-08-24 DIAGNOSIS — F411 Generalized anxiety disorder: Secondary | ICD-10-CM | POA: Diagnosis not present

## 2018-08-24 NOTE — Progress Notes (Signed)
[facility] Counselor/Therapist Progress Note  Patient ID: DIAZ MATUSIK, MRN: [id number],    Date: 08/24/2018  Time Spent: 59 minutes   Treatment Type: Individual Therapy  Reported Symptoms: anxiety, irritabilty  Mental Status Exam:  Appearance:   Casual     Behavior:  Appropriate  Motor:  Normal  Speech/Language:   Clear and Coherent  Affect:  Appropriate  Mood:  anxious and irritable  Thought process:  normal  Thought content:    WNL  Sensory/Perceptual disturbances:    WNL  Orientation:  oriented to person, place, time/date and situation  Attention:  Good  Concentration:  Good  Memory:  WNL  Fund of knowledge:   Good  Insight:    Good  Judgment:   Good  Impulse Control:  Good   Risk Assessment: Danger to Self:  No Self-injurious Behavior: No Danger to Others: No Duty to Warn:no Physical Aggression / Violence:No  Access to Firearms a concern: No  Gang Involvement:No   Subjective: The client states, "I feel terrible."  He describes anxiety and irritability.  His negative thoughts include, "you are stupid, they are terrible."  His subjective units of distress is a 10.  He is very worried about the news concerning the coronavirus.  "I am living in the future too much." The client has 4 air B&B houses which are currently not rented.  Everyone has canceled due to event cancellations in the city from the fear of the virus.  The client states that he is overwhelmed that he will be able to pay the mortgages. Today I used EMDR with the client to begin to process his fear and irritability with everything.  He realized that it went back to his father and how poorly his dad treated him as a child.  He can see how his dad Kea Callan have had an untreated major depressive episode which Strow his anxiety and frustration.  The messages he received from his dad about him was that he was stupid and terrible.  He realized, "I was not loved by my dad."  He did put together that his dad  suffered a lot of losses in his life right as the client was turning 4.  As he put this together his level of disturbance decreased to less than 3.  The client will try to be less controlling, have greater acceptance with others, and not make completing tasks a litmus test for value.  Interventions: Assertiveness/Communication, Solution-Oriented/Positive Psychology, Eye Movement Desensitization and Reprocessing (EMDR) and Insight-Oriented  Diagnosis:   ICD-10-CM   1. Generalized anxiety disorder F41.1     Plan: Acceptance, positive self talk, boundaries.  This record has been created using [employer].  Chart creation errors have been sought, but Desirai Traxler not always have been located and corrected. Such creation errors do not reflect on the standard of medical care.  [name] Uma Jerde, [place]  Treatment Plan  Client Name: Monty Caliendo Date: August 24, 2018  Problems: . Anger: . Anxiety:  . Sadness: . Negative cognitions: . Panic:       . Low self-esteem:  Childhood Trauma:      Verbal Abuse:      Adult Trauma Type: Marland Kitchen Death:        Goals: 1.  Reduce anxiety to a subjective units of distress of less than 2 consistently. 2.  Develop and use assertive behavior and boundaries. 3.  Restructure negative thoughts to more appropriate positive thoughts. 4. 5.  Interventions: Support: :  Skills Training:   Solution-Focused:   EMDR:  :   Talk Therapy:    Approximate length of Treatment:  8-10 Sessions.  Discussed treatment plan with Client: y/n yes  Fredrick A. Earley Grobe, LCMHS

## 2018-09-12 ENCOUNTER — Ambulatory Visit: Payer: BLUE CROSS/BLUE SHIELD | Admitting: Psychiatry

## 2018-09-12 ENCOUNTER — Other Ambulatory Visit: Payer: Self-pay

## 2018-09-29 ENCOUNTER — Ambulatory Visit (INDEPENDENT_AMBULATORY_CARE_PROVIDER_SITE_OTHER): Payer: BLUE CROSS/BLUE SHIELD | Admitting: Psychiatry

## 2018-09-29 ENCOUNTER — Other Ambulatory Visit: Payer: Self-pay

## 2018-09-29 ENCOUNTER — Encounter: Payer: Self-pay | Admitting: Psychiatry

## 2018-09-29 DIAGNOSIS — F411 Generalized anxiety disorder: Secondary | ICD-10-CM | POA: Diagnosis not present

## 2018-09-29 NOTE — Progress Notes (Signed)
      [facility] Counselor/Therapist Progress Note  Patient ID: Michael Powers, MRN: [id number],    Date: 09/29/2018  Time Spent: 57 minutes   Treatment Type: Individual Therapy  Reported Symptoms: anxiety, stress   Mental Status Exam:  Appearance:   Casual     Behavior:  Appropriate  Motor:  Normal  Speech/Language:   Clear and Coherent  Affect:  Appropriate  Mood:  anxious  Thought process:  normal  Thought content:    WNL  Sensory/Perceptual disturbances:    WNL  Orientation:  oriented to person, place, time/date and situation  Attention:  Good  Concentration:  Good  Memory:  WNL  Fund of knowledge:   Good  Insight:    Good  Judgment:   Good  Impulse Control:  Good   Risk Assessment: Danger to Self:  No Self-injurious Behavior: No Danger to Others: No Duty to Warn:no Physical Aggression / Violence:No  Access to Firearms a concern: No  Gang Involvement:No   Subjective: The client states that he is worked 35 days in a row.  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  His fear is, "what is going to go wrong?"  He is concerned about his father who is on chemotherapy for cancer.  With the coronavirus pandemic his daughter has been staying with her mother.  They had concerns about who she might of had contact with and if that could infect his father.  She had been at her mother's for 3 weeks when he found himself one evening being "covered up with dread".  He realized that not talking to her at all had upset him deeply.  He called her mother and grandmother and got things worked out.  He said clear boundaries about what his expectations will be. He has been very worried because of his dad's illness and the impact that getting the corona flu would have.  We used eye-movement today around this fear.  The client subjective units of distress was a 6.  By the end of the session it was less than 1.  The client realized that he was spending too much time going into the  future and developing catastrophic scenarios.  He needed to stay in the present tense and staying mindful.  He will focus on this.   Interventions: Assertiveness/Communication, Mindfulness Meditation, Solution-Oriented/Positive Psychology, [employer] Desensitization and Reprocessing (EMDR) and Insight-Oriented  Diagnosis:   ICD-10-CM   1. Generalized anxiety disorder F41.1     Plan: Mindfulness, self-care, assertiveness, boundaries.  This record has been created using [employer].  Chart creation errors have been sought, but Kindsey Eblin not always have been located and corrected. Such creation errors do not reflect on the standard of medical care.  [name] Alaiyah Bollman, [facility]

## 2018-10-13 ENCOUNTER — Other Ambulatory Visit: Payer: Self-pay

## 2018-10-13 ENCOUNTER — Encounter: Payer: Self-pay | Admitting: Psychiatry

## 2018-10-13 ENCOUNTER — Ambulatory Visit: Payer: BLUE CROSS/BLUE SHIELD | Admitting: Psychiatry

## 2018-10-13 DIAGNOSIS — F411 Generalized anxiety disorder: Secondary | ICD-10-CM

## 2018-10-13 NOTE — Progress Notes (Signed)
      [facility] Counselor/Therapist Progress Note  Patient ID: Michael Powers, MRN: [id number],    Date: 10/13/2018  Time Spent: 58 minutes   Treatment Type: Individual Therapy  Reported Symptoms: nervous, stressed, anxious  Mental Status Exam:  Appearance:   Casual     Behavior:  Appropriate  Motor:  Normal  Speech/Language:   Clear and Coherent  Affect:  Appropriate  Mood:  anxious  Thought process:  normal  Thought content:    WNL  Sensory/Perceptual disturbances:    WNL  Orientation:  oriented to person, place, time/date and situation  Attention:  Good  Concentration:  Good  Memory:  WNL  Fund of knowledge:   Good  Insight:    Good  Judgment:   Good  Impulse Control:  Good   Risk Assessment: Danger to Self:  No Self-injurious Behavior: No Danger to Others: No Duty to Warn:no Physical Aggression / Violence:No  Access to Firearms a concern: No  Gang Involvement:No   Subjective: Telehealth visit I connected with patient by a video enabled telemedicine/telehealth application or telephone, with his informed consent, and verified patient privacy and that I am speaking with the correct person using two identifiers.  I was located at my office and patient at his home.  We discussed the limitations, risks, and security and privacy concerns associated with telehealth services and the availability of in-person appointments, including awareness that he Kresta Templeman be responsible for charges related to the service, and he expressed understanding and agreed to proceed.  I discussed treatment planning with him, with opportunity to ask and answer all questions. Agreed with the plan, demonstrated an understanding of the instructions, and made him aware to call our office if symptoms worsen or he feels he is in a crisis state and needs immediate contact.  Client comes in today stating he is very nervous.  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  Due to the pandemic all of the houses have been canceled.  He recently sold 1.  He realized today he was nervous because he did not want to but then decided it was necessary.  Ultimately he realized that somehow [name] was in it so it was okay. He also has issues with his girlfriend.  She does a lot for him but he is not sure if it is the best relationship for her. Today we used eye-movement around these issues.  His subjective units of distress went from a 5 to less than 2 at the end of the session.  The client will continue to set boundaries and increase his self-care.  Interventions: Assertiveness/Communication, Solution-Oriented/Positive Psychology, Eye Movement Desensitization and Reprocessing (EMDR) and Insight-Oriented  Diagnosis:   ICD-10-CM   1. Generalized anxiety disorder F41.1     Plan: Self-care, boundaries, assertiveness.  This record has been created using [employer].  Chart creation errors have been sought, but Brave Dack not always have been located and corrected. Such creation errors do not reflect on the standard of medical care.  [name] Rajah Tagliaferro, [facility]

## 2018-10-31 ENCOUNTER — Encounter: Payer: Self-pay | Admitting: Psychiatry

## 2018-10-31 ENCOUNTER — Ambulatory Visit (INDEPENDENT_AMBULATORY_CARE_PROVIDER_SITE_OTHER): Payer: BLUE CROSS/BLUE SHIELD | Admitting: Psychiatry

## 2018-10-31 ENCOUNTER — Other Ambulatory Visit: Payer: Self-pay

## 2018-10-31 DIAGNOSIS — F411 Generalized anxiety disorder: Secondary | ICD-10-CM | POA: Diagnosis not present

## 2018-10-31 NOTE — Progress Notes (Signed)
      [facility] Counselor/Therapist Progress Note  Patient ID: CON QUINNEY, MRN: [id number],    Date: 10/31/2018  Time Spent: 57 minutes   Treatment Type: Individual Therapy  Reported Symptoms: sad, anxious.  Mental Status Exam:  Appearance:   Casual     Behavior:  Appropriate  Motor:  Normal  Speech/Language:   Clear and Coherent  Affect:  Appropriate  Mood:  anxious and sad  Thought process:  normal  Thought content:    WNL  Sensory/Perceptual disturbances:    WNL  Orientation:  oriented to person  Attention:  Good  Concentration:  Good  Memory:  WNL  Fund of knowledge:   Good  Insight:    Good  Judgment:   Good  Impulse Control:  Good   Risk Assessment: Danger to Self:  No Self-injurious Behavior: No Danger to Others: No Duty to Warn:no Physical Aggression / Violence:No  Access to Firearms a concern: No  Gang Involvement:No   Subjective: The client client reports he has been very anxious and sad at this past week.  He is realizing he is going to have to end his relationship with his current girlfriend.  He is anxious about not being in relationship and is sad that he has to let her go. I asked the client why he was feeling he had to end his relationship with his girlfriend.  He discussed some of her past judgment and decisions that she is made are things he just cannot live with.  He states he cannot live with the uncertainty of coming home each day and not knowing what might have happened.  This causes him too much anxiety. He is also been upset with his daughter.  He sees how much of her behavior is a carbon copy of her mothers.  He is very frustrated and on edge with how his daughter responds to him.  We discussed parenting strategies such as not engaging in power struggles.  Letting his yes be yes and no be no.  Also discussed making sure that he keeps the correct boundaries with her what he needs.  "I am tired of having to explain myself to her." I used  eye-movement today with the client processed through his anxiety which switched him to some sadness.  The client is very frustrated with his daughter's mother and the terrible job she does parenting her.  He also knows that he needs to move on concerning his girlfriend.  This makes him anxious because of the uncertainty of the future.  He does realized that she makes poor decisions and he cannot tolerate that long-term.  At the end of the session the client subjective units of distress went from a 7+ to less than 1.  His positive cognition was, "I can move forward."  Interventions: Assertiveness/Communication, Solution-Oriented/Positive Psychology, Eye Movement Desensitization and Reprocessing (EMDR) and Insight-Oriented  Diagnosis:   ICD-10-CM   1. Generalized anxiety disorder F41.1     Plan: Boundaries, assertiveness, decision-making, positive self talk.  This record has been created using [employer].  Chart creation errors have been sought, but Carmelina Balducci not always have been located and corrected. Such creation errors do not reflect on the standard of medical care.  [name] Jennifer Holland, [facility]

## 2018-11-14 ENCOUNTER — Ambulatory Visit: Payer: BLUE CROSS/BLUE SHIELD | Admitting: Psychiatry

## 2018-11-16 ENCOUNTER — Ambulatory Visit: Payer: BLUE CROSS/BLUE SHIELD | Admitting: Psychiatry

## 2018-12-01 ENCOUNTER — Ambulatory Visit (INDEPENDENT_AMBULATORY_CARE_PROVIDER_SITE_OTHER): Payer: BC Managed Care – PPO | Admitting: Psychiatry

## 2018-12-01 ENCOUNTER — Encounter: Payer: Self-pay | Admitting: Psychiatry

## 2018-12-01 ENCOUNTER — Other Ambulatory Visit: Payer: Self-pay

## 2018-12-01 DIAGNOSIS — F411 Generalized anxiety disorder: Secondary | ICD-10-CM

## 2018-12-01 NOTE — Progress Notes (Signed)
[facility] Counselor/Therapist Progress Note  Patient ID: Michael Powers, MRN: [id number],    Date: 12/01/2018  Time Spent: 50 minutes   Treatment Type: Individual Therapy  Reported Symptoms: anxiety  Mental Status Exam:  Appearance:   Casual     Behavior:  Appropriate  Motor:  Normal  Speech/Language:   Clear and Coherent  Affect:  Appropriate  Mood:  anxious  Thought process:  normal  Thought content:    WNL  Sensory/Perceptual disturbances:    WNL  Orientation:  oriented to person, place, time/date and situation  Attention:  Good  Concentration:  Good  Memory:  WNL  Fund of knowledge:   Good  Insight:    Good  Judgment:   Good  Impulse Control:  Good   Risk Assessment: Danger to Self:  No Self-injurious Behavior: No Danger to Others: No Duty to Warn:no Physical Aggression / Violence:No  Access to Firearms a concern: No  Gang Involvement:No   Subjective: I met with the client face-to-face.  We both had facemasks. The client reports that his dad's friend is in the hospital with [place].  "We are on high alert."  He notes that, "I have not made the decisions I need to.  I am just waiting." The decisions he needs to make have to do with his relationship with his girlfriend.  They have been dating 3 years this coming July.  He has not seen her in a week and has a lot of uncertainty of how it is going.  "We are not really talking."  He does not want to have the hard conversation because "I do not want to hurt her feelings."  We discussed that this is actually codependent behavior masking as over responsibility.  I discussed with the client that he cannot be responsible for his girlfriend's emotional responses.  She is an adult she can handle them herself.  I did eye-movement with the client around this and his level of anxiety that he felt.  As he processed he understood that he is competent and needs to let other people handle their own stuff.  There are number of things  that he is not comfortable with in their relationship.  I did point out the fact that their relationship was now passed the 71-monthmark where the romantic stage is usually over.  He now sees the relationship as it really is and is not happy with that.  I asked the client to really consider that and pay attention. 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  We discussed that he goes above and beyond which is why his customers like him so much.  We discussed him having a training session with his men to show them the level that he wants them to operate that.  We discussed ways of incentivizing his crew to do the higher level of work.  Client will consider this.  At the end of the session is subjective units of distress was 3+.  It had started at a 7.   Interventions: Assertiveness/Communication, Motivational Interviewing, Solution-Oriented/Positive Psychology, [employer] GroupDesensitization and Reprocessing (EMDR) and Insight-Oriented  Diagnosis:   ICD-10-CM   1. Generalized anxiety disorder  F41.1     Plan: Positive self talk, boundaries, assertiveness, self-care.  This record has been created using [employer]  Chart creation errors have been sought, but Sebron Mcmahill not always have been located and corrected. Such creation errors  do not reflect on the standard of medical care.  Tjay Velazquez, [facility]

## 2018-12-20 ENCOUNTER — Ambulatory Visit (INDEPENDENT_AMBULATORY_CARE_PROVIDER_SITE_OTHER): Payer: BC Managed Care – PPO | Admitting: Psychiatry

## 2018-12-20 ENCOUNTER — Encounter: Payer: Self-pay | Admitting: Psychiatry

## 2018-12-20 ENCOUNTER — Other Ambulatory Visit: Payer: Self-pay

## 2018-12-20 DIAGNOSIS — F411 Generalized anxiety disorder: Secondary | ICD-10-CM | POA: Diagnosis not present

## 2018-12-20 NOTE — Progress Notes (Signed)
[facility] Counselor/Therapist Progress Note  Patient ID: Michael Powers, MRN: [id number],    Date: 12/20/2018  Time Spent: 56 minutes   Treatment Type: Individual Therapy  Reported Symptoms: Anxiety  Mental Status Exam:  Appearance:   Casual     Behavior:  Appropriate  Motor:  Normal  Speech/Language:   Clear and Coherent  Affect:  Appropriate  Mood:  anxious  Thought process:  normal  Thought content:    WNL  Sensory/Perceptual disturbances:    WNL  Orientation:  oriented to person, place, time/date and situation  Attention:  Good  Concentration:  Good  Memory:  WNL  Fund of knowledge:   Good  Insight:    Good  Judgment:   Good  Impulse Control:  Good   Risk Assessment: Danger to Self:  No Self-injurious Behavior: No Danger to Others: No Duty to Warn:no Physical Aggression / Violence:No  Access to Firearms a concern: No  Gang Involvement:No   Subjective: I met with the client face-to-face.  We both had facemasks. The client states that on the subjective units of distress scale from 0-10 is anxiety today is at a "20 ".  He is overwhelmed with issues at work, buying a beach house and trying to figure out which direction he is going to go with his current girlfriend.  His work continues to be very busy.  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  We discussed making checklist of extras that shows their work is above average.  Client will consider this. The client has been frustrated with his father.  Nothing he does is ever good enough.  We used the bilateral stimulation hand paddles.  The client visualized being with his father on the golf course when the client was 38 years old.  In that scenario Jesus intervenes in the conflict between the client and his dad.  The client realized that he had value on his own.  He also saw that his father's anger was really about things that happened to him in the past and had nothing to do with the  client. The client also realizes that he does not respect his girlfriend.  She apparently is depressed when she stays at her mother's house.  He keeps trying to fix her but I explained to the client that maybe this was not his job.  I.e. that is codependent behavior.  The client agreed.  His positive cognition at the end of the session was, "I can let it go."  Interventions: Assertiveness/Communication, Motivational Interviewing, Solution-Oriented/Positive Psychology, Eye Movement Desensitization and Reprocessing (EMDR) and Insight-Oriented  Diagnosis:   ICD-10-CM   1. Generalized anxiety disorder  F41.1     Plan: Boundaries, assertiveness, self-care, acceptance.  This record has been created using [employer].  Chart creation errors have been sought, but Quianna Avery not always have been located and corrected. Such creation errors do not reflect on the standard of medical care.  Elizabethann Lackey, [facility]

## 2019-01-10 ENCOUNTER — Ambulatory Visit (INDEPENDENT_AMBULATORY_CARE_PROVIDER_SITE_OTHER): Payer: BC Managed Care – PPO | Admitting: Psychiatry

## 2019-01-10 ENCOUNTER — Encounter: Payer: Self-pay | Admitting: Psychiatry

## 2019-01-10 ENCOUNTER — Other Ambulatory Visit: Payer: Self-pay

## 2019-01-10 DIAGNOSIS — F411 Generalized anxiety disorder: Secondary | ICD-10-CM | POA: Diagnosis not present

## 2019-01-10 DIAGNOSIS — F4321 Adjustment disorder with depressed mood: Secondary | ICD-10-CM

## 2019-01-10 NOTE — Progress Notes (Signed)
      [facility] Counselor/Therapist Progress Note  Patient ID: Michael Powers, MRN: [id number],    Date: 01/10/2019  Time Spent: 57 minutes   Treatment Type: Individual Therapy  Reported Symptoms: sad, anxious, frustrated.  Mental Status Exam:  Appearance:   Casual     Behavior:  Appropriate  Motor:  Normal  Speech/Language:   Clear and Coherent  Affect:  Appropriate  Mood:  anxious, irritable and sad  Thought process:  normal  Thought content:    WNL  Sensory/Perceptual disturbances:    WNL  Orientation:  oriented to person, place, time/date and situation  Attention:  Good  Concentration:  Good  Memory:  WNL  Fund of knowledge:   Good  Insight:    Good  Judgment:   Good  Impulse Control:  Good   Risk Assessment: Danger to Self:  No Self-injurious Behavior: No Danger to Others: No Duty to Warn:no Physical Aggression / Violence:No  Access to Firearms a concern: No  Gang Involvement:No   Subjective: I met with the client face-to-face both had facemasks. The client states, "I was depressed yesterday I lacked any motivation.  I have not felt good for the last 2 days.  Has been so hot, 93 to 96 degrees daily."  Upon further exploration the client reports that his dad's cancer is worse.  He states his dad stays on the couch and, "asked pitiful."  His father has not been able to do much in the client states he has been there doing a lot for his parents.  The client is also in the process of buying a house at the beach.  "I am worried about the beach house."  "It closes on August 6 but there is still a lot of work to be done.  I pointed out to the client that his anxiety and depressed mood that he has been experiencing is most likely related to the fact that his father is the worst that he has been and that there are many other stressors in his life.  Not only buying a house at the beach but his daughter and girlfriend as well.  The client admits that this is true. I used  eye-movement with the client focusing on these issues in his life.  His negative cognition was, "I feel powerless."  He feels anxiety in his chest.  As the client processed his subjective units of distress went from a 7+ to less than 2.  He states, "I know I can handle this."  Interventions: Assertiveness/Communication, Motivational Interviewing, Solution-Oriented/Positive Psychology, Eye Movement Desensitization and Reprocessing (EMDR) and Insight-Oriented  Diagnosis:   ICD-10-CM   1. Generalized anxiety disorder  F41.1   2. Adjustment disorder with depressed mood  F43.21     Plan: Assertiveness, boundaries, self-care, mood independent behavior, positive self talk.  This record has been created using [employer].  Chart creation errors have been sought, but Brynne Doane not always have been located and corrected. Such creation errors do not reflect on the standard of medical care.  Imajean Mcdermid, [facility]

## 2019-01-18 ENCOUNTER — Ambulatory Visit (INDEPENDENT_AMBULATORY_CARE_PROVIDER_SITE_OTHER): Payer: BC Managed Care – PPO | Admitting: Psychiatry

## 2019-01-18 ENCOUNTER — Other Ambulatory Visit: Payer: Self-pay

## 2019-01-18 ENCOUNTER — Encounter: Payer: Self-pay | Admitting: Psychiatry

## 2019-01-18 DIAGNOSIS — F411 Generalized anxiety disorder: Secondary | ICD-10-CM
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
## 2019-01-20 ENCOUNTER — Ambulatory Visit: Payer: BLUE CROSS/BLUE SHIELD | Admitting: Psychiatry

## 2019-02-09 ENCOUNTER — Ambulatory Visit: Payer: BC Managed Care – PPO | Admitting: Psychiatry

## 2019-02-13 ENCOUNTER — Other Ambulatory Visit: Payer: Self-pay

## 2019-02-13 ENCOUNTER — Encounter: Payer: Self-pay | Admitting: Psychiatry

## 2019-02-13 ENCOUNTER — Ambulatory Visit (INDEPENDENT_AMBULATORY_CARE_PROVIDER_SITE_OTHER): Payer: BC Managed Care – PPO | Admitting: Psychiatry

## 2019-02-13 DIAGNOSIS — F411 Generalized anxiety disorder: Secondary | ICD-10-CM | POA: Diagnosis not present

## 2019-02-13 NOTE — Progress Notes (Signed)
      [facility] Counselor/Therapist Progress Note  Patient ID: Michael Powers, MRN: [id number],    Date: 02/13/2019  Time Spent: 56 minutes   Treatment Type: Individual Therapy  Reported Symptoms: anxiety  Mental Status Exam:  Appearance:   Casual     Behavior:  Appropriate  Motor:  Normal  Speech/Language:   Clear and Coherent  Affect:  Appropriate  Mood:  anxious  Thought process:  normal  Thought content:    WNL  Sensory/Perceptual disturbances:    WNL  Orientation:  oriented to person, place and time/date  Attention:  Good  Concentration:  Good  Memory:  WNL  Fund of knowledge:   Good  Insight:    Good  Judgment:   Good  Impulse Control:  Good   Risk Assessment: Danger to Self:  No Self-injurious Behavior: No Danger to Others: No Duty to Warn:no Physical Aggression / Violence:No  Access to Firearms a concern: No  Gang Involvement:No   Subjective: The client came in today complaining, "I cannot seem not to work.  I cannot relax."  I used eye-movement with the client around this issue.  He feels anxiety and frustration in his heart.  His subjective units of distress was a 10+.   As the client processed he realized that his dad used to work as a way to avoid his responsibility to treat people well.  If he was not being a good father to the client it was because he was working hard to provide for the family.  The client also sees that he does a similar thing.  If something goes wrong his responds, "cannot you see how hard I am working?"  The client also sees that he is working so hard to gain respect and to gain value.  He says that he tries to get respect from people but it does not come.  I pointed out to the client that he is so willing to do everything for everyone then people take him for granted and then take advantage of his good nature and his good will.  We discussed what would appropriate boundaries look like.  He understands that he needs to dial it back and  really care for himself.  The client states that he has started reading a spiritual book that he hopes will help him called, "the imitation of Christ."  That book actually details some of the issues the client is having.  His subjective units of distress at the end of the session was less than 3.  The client had a clear understanding of working less and caring for himself more.  Interventions: Assertiveness/Communication, Motivational Interviewing, Solution-Oriented/Positive Psychology, Psycho-education/Bibliotherapy, Eye Movement Desensitization and Reprocessing (EMDR) and Insight-Oriented  Diagnosis:   ICD-10-CM   1. Generalized anxiety disorder  F41.1     Plan: Read, "the imitation of Christ", self-care, positive self talk, boundaries, assertiveness.  Jewels Langone, [facility]

## 2019-03-02 ENCOUNTER — Ambulatory Visit: Payer: BC Managed Care – PPO | Admitting: Psychiatry

## 2019-03-06 ENCOUNTER — Ambulatory Visit: Payer: BC Managed Care – PPO | Admitting: Psychiatry

## 2019-03-09 DIAGNOSIS — R636 Underweight: Secondary | ICD-10-CM | POA: Diagnosis not present

## 2019-03-09 DIAGNOSIS — I959 Hypotension, unspecified: Secondary | ICD-10-CM | POA: Diagnosis not present

## 2019-03-09 DIAGNOSIS — E860 Dehydration: Secondary | ICD-10-CM | POA: Diagnosis not present

## 2019-03-09 DIAGNOSIS — E162 Hypoglycemia, unspecified: Secondary | ICD-10-CM | POA: Diagnosis not present

## 2019-03-09 DIAGNOSIS — R42 Dizziness and giddiness: Secondary | ICD-10-CM | POA: Diagnosis not present

## 2019-03-20 DIAGNOSIS — T63441A Toxic effect of venom of bees, accidental (unintentional), initial encounter: Secondary | ICD-10-CM | POA: Diagnosis not present

## 2019-03-21 ENCOUNTER — Encounter: Payer: Self-pay | Admitting: Psychiatry

## 2019-03-21 ENCOUNTER — Other Ambulatory Visit: Payer: Self-pay

## 2019-03-21 ENCOUNTER — Ambulatory Visit (INDEPENDENT_AMBULATORY_CARE_PROVIDER_SITE_OTHER): Payer: BC Managed Care – PPO | Admitting: Psychiatry

## 2019-03-21 DIAGNOSIS — F411 Generalized anxiety disorder: Secondary | ICD-10-CM | POA: Diagnosis not present

## 2019-03-21 NOTE — Progress Notes (Signed)
      [facility] Counselor/Therapist Progress Note  Patient ID: Michael Powers, MRN: [id number],    Date: 03/21/2019  Time Spent: 57 minutes   Treatment Type: Individual Therapy  Reported Symptoms: anxiety  Mental Status Exam:  Appearance:   Casual     Behavior:  Appropriate  Motor:  Normal  Speech/Language:   Clear and Coherent  Affect:  Appropriate  Mood:  anxious  Thought process:  normal  Thought content:    WNL  Sensory/Perceptual disturbances:    WNL  Orientation:  oriented to person, place, time/date and situation  Attention:  Good  Concentration:  Good  Memory:  WNL  Fund of knowledge:   Good  Insight:    Good  Judgment:   Good  Impulse Control:  Good   Risk Assessment: Danger to Self:  No Self-injurious Behavior: No Danger to Others: No Duty to Warn:no Physical Aggression / Violence:No  Access to Firearms a concern: No  Gang Involvement:No   Subjective: The client reports that he has gotten back together with his girlfriend.  He realizes now that it Michael Powers not have been the best idea.  We discussed how he tends to fall into relationships that do not have the best outcome for him.  He agrees.  We discussed better boundaries and beginning to identify what are the qualities and attributes that he wants in a relationship?  The client is unsure but he will begin to ponder the question. The client is currently overwhelmed in his [place].  This is one of the busiest times of his year.  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  This has kept him exceptionally busy and burning the candle at both ends.  We discussed how he has such a high level of stress that impacts his relationship with his daughter as well as stressing out his current relationship. Client states that his dad continues to be very ill with prostate cancer.  His health seems to be precarious week to week.   This is made his dad more irritable which complicates the relationship the client has with him.  We discussed what appropriate boundaries look like with his dad.  We also worked on reducing his reactivity. The biological mother of the client's daughter has recently had a new baby.  Because of that, she has not been caring for their daughter as well.  The daughter has missed school and when she is with her mom is completely out of contact with the client.  This infuriates the client and he has considered legally going for full custody.  He does not want his daughter raised in an environment where ignoring her responsibilities is accepted.  Interventions: Assertiveness/Communication, Motivational Interviewing, Solution-Oriented/Positive Psychology and Insight-Oriented  Diagnosis:   ICD-10-CM   1. Generalized anxiety disorder  F41.1     Plan: Positive self talk, self-care, boundaries, assertiveness.  Michael Powers, [facility]

## 2019-04-04 ENCOUNTER — Ambulatory Visit: Payer: BC Managed Care – PPO | Admitting: Psychiatry

## 2019-04-05 ENCOUNTER — Ambulatory Visit (INDEPENDENT_AMBULATORY_CARE_PROVIDER_SITE_OTHER): Payer: BC Managed Care – PPO | Admitting: Psychiatry

## 2019-04-05 ENCOUNTER — Other Ambulatory Visit: Payer: Self-pay

## 2019-04-05 DIAGNOSIS — F4321 Adjustment disorder with depressed mood: Secondary | ICD-10-CM | POA: Diagnosis not present

## 2019-04-05 DIAGNOSIS — F411 Generalized anxiety disorder: Secondary | ICD-10-CM | POA: Diagnosis not present

## 2019-04-05 NOTE — Progress Notes (Signed)
[facility] Counselor/Therapist Progress Note  Patient ID: MERICK KELLEHER, MRN: [id number],    Date: 04/06/2019  Time Spent: 56 minutes   Treatment Type: Individual Therapy  Reported Symptoms: anxious  Mental Status Exam:  Appearance:   Casual     Behavior:  Appropriate  Motor:  Normal  Speech/Language:   Clear and Coherent  Affect:  Appropriate  Mood:  anxious  Thought process:  normal  Thought content:    WNL  Sensory/Perceptual disturbances:    WNL  Orientation:  oriented to person, place, time/date and situation  Attention:  Good  Concentration:  Good  Memory:  WNL  Fund of knowledge:   Good  Insight:    Good  Judgment:   Good  Impulse Control:  Good   Risk Assessment: Danger to Self:  No Self-injurious Behavior: No Danger to Others: No Duty to Warn:no Physical Aggression / Violence:No  Access to Firearms a concern: No  Gang Involvement:No   Subjective: The client stated that he went to the mountains this past weekend with his daughter and parents.  He felt the time was good because he could focus on his daughter.  He and his girlfriend had finally broken up.  This has caused him some sadness and loneliness.  He had recently moved out of his house which he has converted to an air B&B.  His girlfriend thought she was moving into his new apartment with him.  Things have been going so poorly that he told her it was over. I used eye-movement with the client focusing on the break-up with his girlfriend.  His negative cognition is, "I am lonely."  He feels sadness and anxiety in his chest.  His subjective units of distress is a 5.  As the client processed he reviewed all the things that had gone wrong with his girlfriend.  He would realize that her alcohol use caused significant problems even though it was episodic.  When she would drink excessively she would end up in compromising circumstances with the men that she would be flirting with.  This was unacceptable to the  client.  She also would get depressed and stay in bed which was also unacceptable.  We discussed what he needs to look for in a relationship.  The client agrees that he meets someone and then settles very quickly especially if the sexual relationship has started.  The client agrees that he should delay physical intimacy until he knows the woman better.  As the client continue to process issues about his dad came up.  His dad is in the final stages of prostate cancer which has metastasized to his bones.  His father told him over the weekend, "I do not think I am going to make it until Christmas."  I discussed with the client if he had settled his relationship between him and his father?  I encouraged him to do so before his father passed.  The client agreed that he has done as much as he can but will continue to talk to his dad.  His subjective units of distress at the end of the session was less than 2.  Interventions: Assertiveness/Communication, Motivational Interviewing, Solution-Oriented/Positive Psychology, [employer] Desensitization and Reprocessing (EMDR) and Insight-Oriented  Diagnosis:   ICD-10-CM   1. Generalized anxiety disorder  F41.1     Plan: Boundaries, assertiveness, self-care, reconciliation with father.  Daviel Allegretto, [facility]

## 2019-04-06 ENCOUNTER — Encounter: Payer: Self-pay | Admitting: Psychiatry

## 2019-04-18 ENCOUNTER — Ambulatory Visit (INDEPENDENT_AMBULATORY_CARE_PROVIDER_SITE_OTHER): Payer: BC Managed Care – PPO | Admitting: Psychiatry

## 2019-04-18 ENCOUNTER — Other Ambulatory Visit: Payer: Self-pay

## 2019-04-18 ENCOUNTER — Encounter: Payer: Self-pay | Admitting: Psychiatry

## 2019-04-18 DIAGNOSIS — Z91030 Bee allergy status: Secondary | ICD-10-CM | POA: Diagnosis not present

## 2019-04-18 DIAGNOSIS — R05 Cough: Secondary | ICD-10-CM | POA: Diagnosis not present

## 2019-04-18 DIAGNOSIS — J019 Acute sinusitis, unspecified: Secondary | ICD-10-CM | POA: Diagnosis not present

## 2019-04-18 DIAGNOSIS — F4321 Adjustment disorder with depressed mood: Secondary | ICD-10-CM | POA: Diagnosis not present

## 2019-04-18 DIAGNOSIS — R0981 Nasal congestion: Secondary | ICD-10-CM | POA: Diagnosis not present

## 2019-04-18 NOTE — Progress Notes (Signed)
      [facility] Counselor/Therapist Progress Note  Patient ID: Michael Powers, MRN: [id number],    Date: 04/18/2019  Time Spent: 55 minutes   Treatment Type: Individual Therapy  Reported Symptoms: sadness, tearfulness, stress.  Mental Status Exam:  Appearance:   Casual     Behavior:  Appropriate  Motor:  Normal  Speech/Language:   Clear and Coherent  Affect:  Appropriate  Mood:  sad  Thought process:  normal  Thought content:    WNL  Sensory/Perceptual disturbances:    WNL  Orientation:  oriented to person, place, time/date and situation  Attention:  Good  Concentration:  Good  Memory:  WNL  Fund of knowledge:   Good  Insight:    Good  Judgment:   Good  Impulse Control:  Good   Risk Assessment: Danger to Self:  No Self-injurious Behavior: No Danger to Others: No Duty to Warn:no Physical Aggression / Violence:No  Access to Firearms a concern: No  Gang Involvement:No   Subjective: The client states that he has been stressed and crying a lot.  His dad has declined rapidly with his cancer in the last 7 days.  "He is just not getting out of the bed." I used eye-movement with the client around the sadness with his dad's impending death.  His negative cognition is, "I cannot believe it."  His subjective units of distress is a 7.  The client stated, "I know he loves me."  The client and his father have had a tumultuous relationship over the years.  His dad has been very critical and verbally abusive towards the client.  We discussed forgiveness with his dad.  The client felt like he could do this.  I encouraged him to speak directly to his father before he passed.  He states that he would be able to do this today.  He also related that he spoke to the pastor of the church.  This pastor told the client that his dad had told him he was very proud of his son.  The client was very encouraged by this. We also discussed the process of his dad's decline and how it would go.  I encouraged  the client to have radical acceptance with the circumstances.  He agreed that he could turn it over to the care of [name]. He also has had some contact with his ex-girlfriend.  "It went well.  It is been a little easier to be apart."  The client states he has now focusing on taking care of himself.  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  This is a huge departure for the client.  He sees the importance of life as he watches his dad's decline and has made changes accordingly.  At the end of the session the client subjective units of distress was less than 1.  The client knows he can manage this.  Interventions: Assertiveness/Communication, Motivational Interviewing, Solution-Oriented/Positive Psychology, [employer] Desensitization and Reprocessing (EMDR) and Insight-Oriented  Diagnosis:   ICD-10-CM   1. Adjustment disorder with depressed mood  F43.21     Plan: Radical acceptance, positive self talk, forgiveness with dad, assertiveness, boundaries, self-care, exercise.  Shelanda Duvall, [facility]

## 2019-04-25 ENCOUNTER — Other Ambulatory Visit: Payer: Self-pay

## 2019-04-25 ENCOUNTER — Ambulatory Visit: Payer: BC Managed Care – PPO | Admitting: Cardiology

## 2019-04-25 ENCOUNTER — Encounter: Payer: Self-pay | Admitting: Cardiology

## 2019-04-25 VITALS — BP 114/50 | HR 75 | Ht 71.0 in | Wt 141.6 lb

## 2019-04-25 DIAGNOSIS — R0600 Dyspnea, unspecified: Secondary | ICD-10-CM | POA: Diagnosis not present

## 2019-04-25 DIAGNOSIS — R0609 Other forms of dyspnea: Secondary | ICD-10-CM

## 2019-04-25 NOTE — Patient Instructions (Signed)
Medication Instructions:  The current medical regimen is effective;  continue present plan and medications.  *If you need a refill on your cardiac medications before your next appointment, please call your pharmacy*  Testing/Procedures: Your physician has requested that you have an echocardiogram. Echocardiography is a painless test that uses sound waves to create images of your heart. It provides your doctor with information about the size and shape of your heart and how well your heart's chambers and valves are working. This procedure takes approximately one hour. There are no restrictions for this procedure.  Your physician has requested that you have an exercise tolerance test. For further information please visit [email or web address] Please also follow instruction sheet, as given.  Follow-Up: Further follow up will be based on the results of the above tests.  Thank you for choosing [facility]!!     Echocardiogram An echocardiogram is a procedure that uses painless sound waves (ultrasound) to produce an image of the heart. Images from an echocardiogram can provide important information about:  Signs of coronary artery disease (CAD).  Aneurysm detection. An aneurysm is a weak or damaged part of an artery wall that bulges out from the normal force of blood pumping through the body.  Heart size and shape. Changes in the size or shape of the heart can be associated with certain conditions, including heart failure, aneurysm, and CAD.  Heart muscle function.  Heart valve function.  Signs of a past heart attack.  Fluid buildup around the heart.  Thickening of the heart muscle.  A tumor or infectious growth around the heart valves. Tell a health care provider about:  Any allergies you have.  All medicines you are taking, including vitamins, herbs, eye drops, creams, and over-the-counter medicines.  Any blood disorders you have.  Any surgeries you have had.  Any  medical conditions you have.  Whether you are pregnant or may be pregnant. What are the risks? Generally, this is a safe procedure. However, problems may occur, including:  Allergic reaction to dye (contrast) that may be used during the procedure. What happens before the procedure? No specific preparation is needed. You may eat and drink normally. What happens during the procedure?   An IV tube may be inserted into one of your veins.  You may receive contrast through this tube. A contrast is an injection that improves the quality of the pictures from your heart.  A gel will be applied to your chest.  A wand-like tool (transducer) will be moved over your chest. The gel will help to transmit the sound waves from the transducer.  The sound waves will harmlessly bounce off of your heart to allow the heart images to be captured in real-time motion. The images will be recorded on a computer. The procedure may vary among health care providers and hospitals. What happens after the procedure?  You may return to your normal, everyday life, including diet, activities, and medicines, unless your health care provider tells you not to do that. Summary  An echocardiogram is a procedure that uses painless sound waves (ultrasound) to produce an image of the heart.  Images from an echocardiogram can provide important information about the size and shape of your heart, heart muscle function, heart valve function, and fluid buildup around your heart.  You do not need to do anything to prepare before this procedure. You may eat and drink normally.  After the echocardiogram is completed, you may return to your normal, everyday life, unless your health  care provider tells you not to do that. This information is not intended to replace advice given to you by your health care provider. Make sure you discuss any questions you have with your health care provider. Document Released: 05/29/2000 Document  Revised: 09/22/2018 Document Reviewed: 07/04/2016 Elsevier Patient Education  2020 [place] are scheduled for an Exercise Stress Test on      at      Please arrive 15 minutes prior to your appointment time for registration and insurance purposes.  The test will take approximately 45 minutes to complete.  How to prepare for your Exercise Stress Test: .        You will need COVID testing prior to having this test.  This is completed at [place].  Stay in the right lane for procedures.  This test will need to be done several days before your treadmill test in order for [place] to receive the results.  Once you have the screening, you will need to self-isolate until the time of your treadmill. . You may take your normal medications with sips of water. . Do wear comfortable clothes (no dresses or overalls) and walking shoes, tennis shoes preferred (no heels or open toed shoes are allowed) . Do Not wear cologne, perfume, aftershave or lotions (deodorant is allowed). . Please report to [place], [address] for your test.  If these instructions are not followed, your test will have to be rescheduled.  If you have questions or concerns about your appointment, you can call the Stress Lab at [phone].  If you cannot keep your appointment, please provide 24 hours notification to the Stress Lab, to avoid a possible $50 charge to your account.
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
## 2019-05-02 ENCOUNTER — Ambulatory Visit: Payer: BC Managed Care – PPO | Admitting: Psychiatry

## 2019-05-02 ENCOUNTER — Ambulatory Visit (INDEPENDENT_AMBULATORY_CARE_PROVIDER_SITE_OTHER): Payer: BC Managed Care – PPO | Admitting: Psychiatry

## 2019-05-02 ENCOUNTER — Ambulatory Visit (HOSPITAL_COMMUNITY): Payer: BC Managed Care – PPO | Attending: Cardiology

## 2019-05-02 ENCOUNTER — Other Ambulatory Visit: Payer: Self-pay

## 2019-05-02 ENCOUNTER — Encounter: Payer: Self-pay | Admitting: Psychiatry

## 2019-05-02 DIAGNOSIS — F4323 Adjustment disorder with mixed anxiety and depressed mood: Secondary | ICD-10-CM | POA: Diagnosis not present

## 2019-05-02 DIAGNOSIS — R0609 Other forms of dyspnea: Secondary | ICD-10-CM

## 2019-05-02 DIAGNOSIS — R0600 Dyspnea, unspecified: Secondary | ICD-10-CM | POA: Diagnosis not present
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
## 2019-05-03 ENCOUNTER — Encounter (HOSPITAL_COMMUNITY): Payer: Self-pay | Admitting: Cardiology

## 2019-05-15 ENCOUNTER — Telehealth (HOSPITAL_COMMUNITY): Payer: Self-pay

## 2019-05-15 NOTE — Telephone Encounter (Signed)
[address] patient.   Thank you!   

## 2019-05-15 NOTE — Telephone Encounter (Signed)
New message    Just an FYI. We have made several attempts to contact this patient including sending a letter to schedule or reschedule their echocardiogram. We will be removing the patient from the echo WQ.   11.23.20 @ 2:54pm both # are the same - lm on vm  - Michael Powers   11.18.20 @ 4;10pm both # are the same  - lm on vm - mail reminder letter Michael Powers  11.13.20 @ 3:59pm lm on home vm - Michael Powers

## 2019-05-17 ENCOUNTER — Ambulatory Visit (INDEPENDENT_AMBULATORY_CARE_PROVIDER_SITE_OTHER): Payer: BC Managed Care – PPO | Admitting: Psychiatry

## 2019-05-17 ENCOUNTER — Encounter: Payer: Self-pay | Admitting: Psychiatry

## 2019-05-17 ENCOUNTER — Other Ambulatory Visit: Payer: Self-pay

## 2019-05-17 DIAGNOSIS — F4321 Adjustment disorder with depressed mood: Secondary | ICD-10-CM

## 2019-05-17 NOTE — Progress Notes (Signed)
      [facility] Counselor/Therapist Progress Note  Patient ID: Michael Powers, MRN: [id number],    Date: 05/17/2019  Time Spent: 56 minutes   Treatment Type: Individual Therapy  Reported Symptoms: sad.  Mental Status Exam:  Appearance:   Casual     Behavior:  Appropriate  Motor:  Normal  Speech/Language:   Clear and Coherent  Affect:  Appropriate  Mood:  sad  Thought process:  normal  Thought content:    WNL  Sensory/Perceptual disturbances:    WNL  Orientation:  oriented to person, place, time/date and situation  Attention:  Good  Concentration:  Good  Memory:  WNL  Fund of knowledge:   Good  Insight:    Good  Judgment:   Good  Impulse Control:  Good   Risk Assessment: Danger to Self:  No Self-injurious Behavior: No Danger to Others: No Duty to Warn:no Physical Aggression / Violence:No  Access to Firearms a concern: No  Gang Involvement:No   Subjective: The client states that his dad is being discharged from the hospital today.  He went in this past Saturday after collapsing at home.  "He is dying."  He states his dad's pain has been very bad. "I am not doing well.  I am very, very, very sad."  He states he has temporarily reunited with his old girlfriend.  "I need her now."  The client went on to discuss the fact that he wants his own family.  With his dad's imminent death he feels untethered without a wife and the children that he wants. I used eye-movement along with the bilateral stimulation hand paddles with the client to help him reduce his subjective units of distress from a 7+ to less than 2.  He did a visualization with him and his dad.  He then saw Jesus walk into the room.  He was surprised when Jesus told them both he was proud of them.  He told the client that he needed to let go of the past and walk in forgiveness and acceptance today.  The client states he would. "I am working on contentment."  This was his positive cognition at the end of the  session.  Interventions: Assertiveness/Communication, Mindfulness Meditation, Motivational Interviewing, Solution-Oriented/Positive Psychology, [employer] Desensitization and Reprocessing (EMDR) and Insight-Oriented  Diagnosis:   ICD-10-CM   1. Adjustment disorder with depressed mood  F43.21     Plan: Forgiveness, contentment, mood independent behavior, assertiveness, boundaries, self-care, conversation with dad.  Bane Hagy, [facility]

## 2019-06-02 ENCOUNTER — Ambulatory Visit: Payer: BC Managed Care – PPO | Admitting: Psychiatry

## 2019-06-21 ENCOUNTER — Ambulatory Visit (INDEPENDENT_AMBULATORY_CARE_PROVIDER_SITE_OTHER): Payer: BC Managed Care – PPO | Admitting: Psychiatry

## 2019-06-21 ENCOUNTER — Encounter: Payer: Self-pay | Admitting: Psychiatry

## 2019-06-21 ENCOUNTER — Other Ambulatory Visit: Payer: Self-pay

## 2019-06-21 DIAGNOSIS — F4323 Adjustment disorder with mixed anxiety and depressed mood: Secondary | ICD-10-CM

## 2019-06-21 NOTE — Progress Notes (Signed)
      [facility] Counselor/Therapist Progress Note  Patient ID: Michael Powers, MRN: [id number],    Date: 06/21/2019  Time Spent: 50 minutes   Treatment Type: Individual Therapy  Reported Symptoms: sad, anxious  Mental Status Exam:  Appearance:   Casual     Behavior:  Appropriate  Motor:  Normal  Speech/Language:   Clear and Coherent  Affect:  Appropriate  Mood:  anxious and sad  Thought process:  normal  Thought content:    WNL  Sensory/Perceptual disturbances:    WNL  Orientation:  oriented to person, place, time/date and situation  Attention:  Good  Concentration:  Good  Memory:  WNL  Fund of knowledge:   Good  Insight:    Good  Judgment:   Good  Impulse Control:  Good   Risk Assessment: Danger to Self:  No Self-injurious Behavior: No Danger to Others: No Duty to Warn:no Physical Aggression / Violence:No  Access to Firearms a concern: No  Gang Involvement:No   Subjective: The client states that he has been having anxiety late in the evening.  It includes a headache and overall muscle stress.  We discussed that with his dad now on hospice and he is helping his mother care for him that his stress level is very high.  It is no doubt expressing itself with the headache and stress.  He also states his sleep has been restless and he has been dreaming a lot.  He states his sister and his mother are all planning, trying to get a handle on what is going on with his dad.  The client did state that he has forgiven his dad. I discussed telling his dad directly so that he has no doubts that his dad knows that he has forgiven him.  The client states he most likely will write it down and give it to his dad.  He is uncertain with the death process.  I explained that once his dad's BMI drops below 16 death becomes inevitable.  Right now his dad's BMI is at a 20 and he weighs 160 pounds.  This was helpful for the client to know those markers.  He continues to focus on his self-care.  He is  giving up his apartment and moving back in with his mom to help with his dad.  I used the bilateral stimulation hand paddles with the client as he discussed these issues with his dad.  His subjective units of distress went from a 5+ to less than 1 at the end of the session.  Interventions: Assertiveness/Communication, Motivational Interviewing, Solution-Oriented/Positive Psychology, Michael Powers Desensitization and Reprocessing (EMDR) and Insight-Oriented  Diagnosis:   ICD-10-CM   1. Adjustment disorder with mixed anxiety and depressed mood  F43.23     Plan: Self-care, assertiveness, forgiveness with dad, boundaries, positive self talk.  [name] Kalup Jaquith, [facility]

## 2019-07-12 ENCOUNTER — Ambulatory Visit: Payer: BC Managed Care – PPO | Admitting: Psychiatry

## 2019-07-26 ENCOUNTER — Encounter: Payer: Self-pay | Admitting: Psychiatry

## 2019-07-26 ENCOUNTER — Ambulatory Visit (INDEPENDENT_AMBULATORY_CARE_PROVIDER_SITE_OTHER): Payer: BC Managed Care – PPO | Admitting: Psychiatry

## 2019-07-26 ENCOUNTER — Other Ambulatory Visit: Payer: Self-pay

## 2019-07-26 DIAGNOSIS — F4323 Adjustment disorder with mixed anxiety and depressed mood: Secondary | ICD-10-CM | POA: Diagnosis not present

## 2019-07-26 DIAGNOSIS — Z20828 Contact with and (suspected) exposure to other viral communicable diseases: Secondary | ICD-10-CM | POA: Diagnosis not present
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
## 2019-08-09 ENCOUNTER — Ambulatory Visit: Payer: BC Managed Care – PPO | Admitting: Psychiatry

## 2019-08-29 ENCOUNTER — Ambulatory Visit (INDEPENDENT_AMBULATORY_CARE_PROVIDER_SITE_OTHER): Payer: BC Managed Care – PPO | Admitting: Psychiatry

## 2019-08-29 ENCOUNTER — Encounter: Payer: Self-pay | Admitting: Psychiatry

## 2019-08-29 ENCOUNTER — Other Ambulatory Visit: Payer: Self-pay

## 2019-08-29 DIAGNOSIS — F4323 Adjustment disorder with mixed anxiety and depressed mood: Secondary | ICD-10-CM

## 2019-08-29 NOTE — Progress Notes (Signed)
      [facility] Counselor/Therapist Progress Note  Patient ID: Michael Powers, MRN: [id number],    Date: 08/29/2019  Time Spent: 50 minutes   Treatment Type: Individual Therapy  Reported Symptoms: anxious, sad  Mental Status Exam:  Appearance:   Casual     Behavior:  Appropriate  Motor:  Normal  Speech/Language:   Clear and Coherent  Affect:  Appropriate  Mood:  anxious and sad  Thought process:  normal  Thought content:    WNL  Sensory/Perceptual disturbances:    WNL  Orientation:  oriented to person, place, time/date and situation  Attention:  Good  Concentration:  Good  Memory:  WNL  Fund of knowledge:   Good  Insight:    Good  Judgment:   Good  Impulse Control:  Good   Risk Assessment: Danger to Self:  No Self-injurious Behavior: No Danger to Others: No Duty to Warn:no Physical Aggression / Violence:No  Access to Firearms a concern: No  Gang Involvement:No   Subjective: "I miss my dad."  He states he does better in the morning but by afternoon he starts to get foggy.  "This past weekend I hit the wall.  I thought I had to go to the ER.  I think I was having a lot of anxiety."  The client realized that he was probably dehydrated and once he got some water he felt much better.  His main issues today has to do with his sadness about his dad's death.  "I try to carry on, but I do hurt."  He and his immediate [place] and stayed at the house that his dad last stayed at.  He became very tearful and sad through the course of the session when he talked about this.  He noted how it was important to him to have family and he loved it so much.  Today I used the bilateral stimulation hand paddles with the client as he talked about these things.  He noticed that his neighbor down the road overdosed on heroin and died.  He was very sad about that.  He is worried about his mom living by herself even though he is there in the evening.  His current girlfriend he  realizes is just not working out.  He knows he needs to let her go but does not want to be alone.  We discussed the importance of giving himself some other options.  He agrees.  We used some eye-movement around the death of his dad.  He was much more at peace with it.  He realizes he just needs to not try to control everything.  Interventions: Assertiveness/Communication, Mindfulness Meditation, Motivational Interviewing, Solution-Oriented/Positive Psychology, [employer] Desensitization and Reprocessing (EMDR) and Insight-Oriented  Diagnosis:   ICD-10-CM   1. Adjustment disorder with mixed anxiety and depressed mood  F43.23     Plan: Positive self talk, self-care, mood independent behavior, assertiveness, boundaries.  [name] Tildon Silveria, [facility]

## 2019-09-11 DIAGNOSIS — F411 Generalized anxiety disorder: Secondary | ICD-10-CM | POA: Diagnosis not present

## 2019-09-11 DIAGNOSIS — F39 Unspecified mood [affective] disorder: Secondary | ICD-10-CM | POA: Diagnosis not present

## 2019-09-11 DIAGNOSIS — F908 Attention-deficit hyperactivity disorder, other type: Secondary | ICD-10-CM | POA: Diagnosis not present

## 2019-09-13 ENCOUNTER — Ambulatory Visit (INDEPENDENT_AMBULATORY_CARE_PROVIDER_SITE_OTHER): Payer: BC Managed Care – PPO | Admitting: Psychiatry

## 2019-09-13 ENCOUNTER — Encounter: Payer: Self-pay | Admitting: Psychiatry

## 2019-09-13 ENCOUNTER — Other Ambulatory Visit: Payer: Self-pay

## 2019-09-13 DIAGNOSIS — F4323 Adjustment disorder with mixed anxiety and depressed mood: Secondary | ICD-10-CM | POA: Diagnosis not present

## 2019-09-13 NOTE — Progress Notes (Signed)
      [facility] Counselor/Therapist Progress Note  Patient ID: Michael Powers, MRN: [id number],    Date: 09/13/2019  Time Spent: 50 minutes   Treatment Type: Individual Therapy  Reported Symptoms: anxiety, sadness  Mental Status Exam:  Appearance:   Casual     Behavior:  Appropriate  Motor:  Normal  Speech/Language:   Clear and Coherent  Affect:  Appropriate  Mood:  anxious and sad  Thought process:  normal  Thought content:    WNL  Sensory/Perceptual disturbances:    WNL  Orientation:  oriented to person, place, time/date and situation  Attention:  Good  Concentration:  Good  Memory:  WNL  Fund of knowledge:   Good  Insight:    Good  Judgment:   Good  Impulse Control:  Good   Risk Assessment: Danger to Self:  No Self-injurious Behavior: No Danger to Others: No Duty to Warn:no Physical Aggression / Violence:No  Access to Firearms a concern: No  Gang Involvement:No   Subjective: The client states that earlier in the week he had, "thought himself into a depression."  He realized how much his thought process contributes to his mood.  We discussed using mindfulness actively as a way to control that process.  The client found that when he distracted himself through work and focused his attention on other things he felt much better. The client also recently followed up with his physician who diagnosed him with attention deficit hyperactivity disorder.  The doctor first has started him on Lexapro which the client has taken in the past to help manage his anxiety and depression.  This has been more complicated since his father's death.  He states he and his mother will still talk about the clients that and freely week with each.  I encouraged the client to continue in that process.  He agreed. The client has continued in his relationship.  I asked the client what has been the main problem with that?  He has expressed dissatisfaction in the past.  He stated today that he felt like it  was probably part of his perfections.  I discussed with the client that he had admitted to being critical and controlling with her.  He agreed.  Had he noticed that she is very much like his mother and he has been very much acting his father?  This was a revelation for the client.  I encouraged him to reevaluate his own behavior and not expect his girlfriend to make all the changes.  Otherwise there is too much of a power differential between them.  I challenged the client to think about how well he loves her even at his own expense.  I used eye-movement with the client throughout this to help him process his thoughts and gain clarity on what he is feeling. He agreed.  Interventions: Assertiveness/Communication, Mindfulness Meditation, Motivational Interviewing, Solution-Oriented/Positive Psychology, [employer] Desensitization and Reprocessing (EMDR) and Insight-Oriented  Diagnosis:   ICD-10-CM   1. Adjustment disorder with mixed anxiety and depressed mood  F43.23     Plan: Thinking of others first, positive self talk, mindfulness, self-care, follow-up with physician, continue his medications, assertiveness, boundaries.  [name] Tayana Shankle, [facility]

## 2019-10-02 ENCOUNTER — Encounter: Payer: Self-pay | Admitting: Psychiatry

## 2019-10-02 ENCOUNTER — Ambulatory Visit (INDEPENDENT_AMBULATORY_CARE_PROVIDER_SITE_OTHER): Payer: BC Managed Care – PPO | Admitting: Psychiatry

## 2019-10-02 ENCOUNTER — Other Ambulatory Visit: Payer: Self-pay

## 2019-10-02 DIAGNOSIS — F4321 Adjustment disorder with depressed mood: Secondary | ICD-10-CM

## 2019-10-02 NOTE — Progress Notes (Signed)
      [facility] Counselor/Therapist Progress Note  Patient ID: Michael Powers, MRN: [id number],    Date: 10/02/2019  Time Spent: 50 minutes   Treatment Type: Individual Therapy  Reported Symptoms: anxious, sad  Mental Status Exam:  Appearance:   Casual     Behavior:  Appropriate  Motor:  Normal  Speech/Language:   Clear and Coherent  Affect:  Appropriate  Mood:  angry and sad  Thought process:  normal  Thought content:    WNL  Sensory/Perceptual disturbances:    WNL  Orientation:  oriented to person, place, time/date and situation  Attention:  Good  Concentration:  Good  Memory:  WNL  Fund of knowledge:   Good  Insight:    Good  Judgment:   Good  Impulse Control:  Good   Risk Assessment: Danger to Self:  No Self-injurious Behavior: No Danger to Others: No Duty to Warn:no Physical Aggression / Violence:No  Access to Firearms a concern: No  Gang Involvement:No   Subjective: The client states that he has been very busy at work.  He notes that he has had times of where he has been very sad and misses his dad and then angry at how his dad treated him.  He states he and his mom get choked up trying to talk about his father.  Today I used eye-movement focusing on the client's father.  His negative cognition is, "I miss him."  He feels sadness and anger in his chest.  His subjective units of distress is a 6.  As the client processed he remembered many events from the past where his dad was critical and mean to him.  Then he would think of other events where he really enjoyed his time with his dad but at the end his father would get angry about something.  The client was concerned that because he did not cry very much he must not love his dad.  I explained to the client that this was not true.  Today is a case in point, where the client got very tearful when he thought about his dad.  I encouraged the client to remember that grief is a process that he needs to have acceptance with.   He agreed.  I suggested he continue to talk with his mom and sister about memories they have of his dad.  Let the emotions come as they will.  The client agreed.  His subjective units of distress was less than 2 at the end of the session.  Interventions: Assertiveness/Communication, Motivational Interviewing, Solution-Oriented/Positive Psychology, [employer] Desensitization and Reprocessing (EMDR) and Insight-Oriented  Diagnosis:   ICD-10-CM   1. Adjustment disorder with mixed anxiety and depressed mood  F43.23     Plan: Grief process, self-care, positive self talk, assertiveness, boundaries, dialogue with family.  Michael Powers, [facility]

## 2019-10-23 ENCOUNTER — Ambulatory Visit: Payer: BC Managed Care – PPO | Admitting: Psychiatry

## 2019-10-25 ENCOUNTER — Other Ambulatory Visit: Payer: Self-pay

## 2019-10-25 ENCOUNTER — Encounter: Payer: Self-pay | Admitting: Psychiatry

## 2019-10-25 ENCOUNTER — Ambulatory Visit (INDEPENDENT_AMBULATORY_CARE_PROVIDER_SITE_OTHER): Payer: BC Managed Care – PPO | Admitting: Psychiatry

## 2019-10-25 DIAGNOSIS — F4323 Adjustment disorder with mixed anxiety and depressed mood: Secondary | ICD-10-CM | POA: Diagnosis not present

## 2019-10-25 NOTE — Progress Notes (Signed)
      [facility] Counselor/Therapist Progress Note  Patient ID: Michael Powers, MRN: [id number],    Date: 10/25/2019  Time Spent: 57 minutes   Treatment Type: Individual Therapy  Reported Symptoms: anxiety, sadness  Mental Status Exam:  Appearance:   Casual     Behavior:  Appropriate  Motor:  Normal  Speech/Language:   Clear and Coherent  Affect:  Appropriate  Mood:  anxious and sad  Thought process:  normal  Thought content:    WNL  Sensory/Perceptual disturbances:    WNL  Orientation:  oriented to person, place, time/date and situation  Attention:  Good  Concentration:  Good  Memory:  WNL  Fund of knowledge:   Good  Insight:    Good  Judgment:   Good  Impulse Control:  Good   Risk Assessment: Danger to Self:  No Self-injurious Behavior: No Danger to Others: No Duty to Warn:no Physical Aggression / Violence:No  Access to Firearms a concern: No  Gang Involvement:No   Subjective: The client comes in today stating he is "overwhelmed".  His girlfriend is sick.  His daughter is having difficulty in school.  His work is going Loss adjuster, [employer].  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  He still misses his dad.  As the client focused on this I used eye-movement with the client.  His negative cognition is, "I am overwhelmed."  His subjective units of distress is a 10+.  As he processed he became sad and tearful because he missed his dad so much.  "I do not miss him yelling at me though."  He states that his work plate is very full.  He will be spending 4 days at the beach renovating the exterior of his beach house along with the yard.  The weather has to hold so he can get the work done.  As he continued to process his anxiety and stress slowly came down.  As he continued to think about it he realized that it would all work out.  His positive cognition at the end of the session was, "everything will be okay his subjective units of distress  is less than 3.  Interventions: Assertiveness/Communication, Motivational Interviewing, Solution-Oriented/Positive Psychology, [employer] Desensitization and Reprocessing (EMDR) and Insight-Oriented  Diagnosis:   ICD-10-CM   1. Adjustment disorder with mixed anxiety and depressed mood  F43.23     Plan: Mood independent behavior, positive self talk, self-care, assertiveness, boundaries.  Michael Powers, [facility]

## 2019-11-03 DIAGNOSIS — F411 Generalized anxiety disorder: Secondary | ICD-10-CM | POA: Diagnosis not present

## 2019-11-03 DIAGNOSIS — F39 Unspecified mood [affective] disorder: Secondary | ICD-10-CM | POA: Diagnosis not present

## 2019-11-16 ENCOUNTER — Encounter: Payer: Self-pay | Admitting: Psychiatry

## 2019-11-16 ENCOUNTER — Ambulatory Visit (INDEPENDENT_AMBULATORY_CARE_PROVIDER_SITE_OTHER): Payer: BC Managed Care – PPO | Admitting: Psychiatry

## 2019-11-16 ENCOUNTER — Other Ambulatory Visit: Payer: Self-pay

## 2019-11-16 DIAGNOSIS — F4323 Adjustment disorder with mixed anxiety and depressed mood: Secondary | ICD-10-CM | POA: Diagnosis not present

## 2019-11-16 NOTE — Progress Notes (Signed)
      [facility] Counselor/Therapist Progress Note  Patient ID: Michael Powers, MRN: [id number],    Date: 11/16/2019  Time Spent: 50 minutes   Treatment Type: Individual Therapy  Reported Symptoms: anxiety, irritability  Mental Status Exam:  Appearance:   Casual     Behavior:  Appropriate  Motor:  Normal  Speech/Language:   Clear and Coherent  Affect:  Appropriate  Mood:  anxious and irritable  Thought process:  normal  Thought content:    WNL  Sensory/Perceptual disturbances:    WNL  Orientation:  oriented to person, place, time/date and situation  Attention:  Good  Concentration:  Good  Memory:  WNL  Fund of knowledge:   Good  Insight:    Good  Judgment:   Good  Impulse Control:  Good   Risk Assessment: Danger to Self:  No Self-injurious Behavior: No Danger to Others: No Duty to Warn:no Physical Aggression / Violence:No  Access to Firearms a concern: No  Gang Involvement:No   Subjective: "I needed to be here yesterday."  The client stated that he had anxiety attacks at his job.  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  He stated it all started this past Friday when the mowing crew skipped lawns that really needed to be mown.  He became upset with him because of their poor attitude about working. I used eye-movement with the client focusing on his anger and anxiety.  His mind quickly skipped to the thought, "I do not want to be alone."  The client recently broke up with his girlfriend of 4 years.  "I realize I am a bad communicator."  This brings on a feeling of doom for him.  I pointed out to the client that when he got mad with his crew his communication suffered as well.  "I'm just like my dad.  He taught me to be like him."  I challenged that with the client stating that he is very emotionally intelligent and can handle things differently.  We discussed that when he gets mad rather than cussing everyone out he needs to be more concrete and  ask for what he wants.  I suggested that he use his anger to direct what he needs.  The client realizes that he is too emotionally invested in the people he works with and is therefore influenced by their mood, body language, and attitude.  We talked about practicing more detachment.  These are employees they are not friends. In terms of being alone, the client knows that he needs to work on his communication in relationships as well.  His subjective units of distress went from a 7 to less than 2.  His positive cognition was, "I can ask for what I want."  Interventions: Assertiveness/Communication, Motivational Interviewing, Solution-Oriented/Positive Psychology, Eye Movement Desensitization and Reprocessing (EMDR) and Insight-Oriented  Diagnosis:   ICD-10-CM   1. Adjustment disorder with mixed anxiety and depressed mood  F43.23     Plan: Mood independent behavior, positive self talk, self-care, assertiveness, boundaries, ask for what he wants.  Michael Powers, [facility]

## 2019-12-06 ENCOUNTER — Other Ambulatory Visit: Payer: Self-pay

## 2019-12-06 ENCOUNTER — Ambulatory Visit (INDEPENDENT_AMBULATORY_CARE_PROVIDER_SITE_OTHER): Payer: BC Managed Care – PPO | Admitting: Psychiatry

## 2019-12-06 ENCOUNTER — Encounter: Payer: Self-pay | Admitting: Psychiatry

## 2019-12-06 DIAGNOSIS — F4323 Adjustment disorder with mixed anxiety and depressed mood: Secondary | ICD-10-CM | POA: Diagnosis not present

## 2019-12-06 NOTE — Progress Notes (Signed)
      [facility] Counselor/Therapist Progress Note  Patient ID: Michael Powers, MRN: [id number],    Date: 12/06/2019  Time Spent: 50 minutes   Treatment Type: Individual Therapy  Reported Symptoms: anxiety, sad  Mental Status Exam:  Appearance:   Casual     Behavior:  Appropriate  Motor:  Normal  Speech/Language:   Clear and Coherent  Affect:  Appropriate  Mood:  anxious and sad  Thought process:  normal  Thought content:    WNL  Sensory/Perceptual disturbances:    WNL  Orientation:  oriented to person, place, time/date and situation  Attention:  Good  Concentration:  Good  Memory:  WNL  Fund of knowledge:   Good  Insight:    Good  Judgment:   Good  Impulse Control:  Good   Risk Assessment: Danger to Self:  No Self-injurious Behavior: No Danger to Others: No Duty to Warn:no Physical Aggression / Violence:No  Access to Firearms a concern: No  Gang Involvement:No   Subjective: The client states today that he feels good.  He stated towards the end of last week he felt very depressed.  He was also depressed on Father's Day reflecting on his father's death.  The client had talked at length in the past about how abusive and demeaning his father had been to him.  I asked the client what made him so sad?  The client admitted that he thought he should be sad so he just tried to think about all the good things that his dad did.  I discussed with the client that reflecting on the good and the bad and learning from that how he wants to live his life would actually be more productive.  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  His father's way of dealing with employees was very authoritarian and one way in the discussion.  The client describes interactions that are much more authoritative.  He finds things that he can complement and validate his employees on and then discusses the problems in nonthreatening ways.   He is finding that he is making more progress and getting better work out of his employees. The client also also realized today that his mood is too reactive to his circumstances.  If the circumstances are bad, he is bad.  If the circumstances are good then he feels good.  I discussed with the client about being more aware of how he thinks and the influence on his mood.  The client agreed.  We discussed the cognitive triangle and how to pay attention to that.  During the course of the session the client use the bilateral stimulation hand paddles which helped reduce his anxiety and irritability from his subjective units of distress of 6 to less than 1 by the end of the session.  His positive cognition was, "I can be intentional."  Interventions: Cognitive Behavioral Therapy, Assertiveness/Communication, Motivational Interviewing, Solution-Oriented/Positive Psychology, Eye Movement Desensitization and Reprocessing (EMDR) and Insight-Oriented  Diagnosis:   ICD-10-CM   1. Adjustment disorder with mixed anxiety and depressed mood  F43.23     Plan: Intentionality, positive self talk, mood independent behavior, self-care, assertiveness, boundaries.  Michael Powers, [facility]

## 2019-12-27 ENCOUNTER — Other Ambulatory Visit: Payer: Self-pay

## 2019-12-27 ENCOUNTER — Ambulatory Visit (INDEPENDENT_AMBULATORY_CARE_PROVIDER_SITE_OTHER): Payer: BC Managed Care – PPO | Admitting: Psychiatry

## 2019-12-27 ENCOUNTER — Encounter: Payer: Self-pay | Admitting: Psychiatry

## 2019-12-27 DIAGNOSIS — F4323 Adjustment disorder with mixed anxiety and depressed mood: Secondary | ICD-10-CM

## 2019-12-27 NOTE — Progress Notes (Signed)
[facility] Counselor/Therapist Progress Note  Patient ID: Michael Powers, MRN: [id number],    Date: 12/27/2019  Time Spent: 55 minutes   Treatment Type: Individual Therapy  Reported Symptoms: anxious, irritable  Mental Status Exam:  Appearance:   Casual     Behavior:  Appropriate  Motor:  Normal  Speech/Language:   Clear and Coherent  Affect:  Appropriate  Mood:  anxious and irritable  Thought process:  normal  Thought content:    WNL  Sensory/Perceptual disturbances:    WNL  Orientation:  oriented to person, place, time/date and situation  Attention:  Good  Concentration:  Good  Memory:  WNL  Fund of knowledge:   Good  Insight:    Good  Judgment:   Good  Impulse Control:  Good   Risk Assessment: Danger to Self:  No Self-injurious Behavior: No Danger to Others: No Duty to Warn:no Physical Aggression / Violence:No  Access to Firearms a concern: No  Gang Involvement:No   Subjective: The client complains today of being out of energy.  I did point out that it was over 90 degrees today and the client works 12-hour days outside.  He agreed that could be the reason he is tired.  One of the other things that he noticed was this free-floating anxiety that he was experiencing.  I started with eye-movement with the client focusing on his anxiety.  He feels it in his chest.  It is a subjective units of distress of 5.  As the client processed he realized that it was about his girlfriend.  He and his girlfriend have been on and off this past year.  He stated recently that she has made a lot of changes that have been very positive.  The client's bigger issue has been his lack of communication with her.  I encouraged the client to be direct to her about what he needs or wants.  He thinks he might want to marry her but is unsure.  He wants to wait to see if things continue to go well.  As I pressed the client about what his reservations were he stated that "she has heavy legs."  Client  states that his girlfriend's father weighs 400 pounds.  He is worried that in 10 years she could be very heavy as well.  The client also pointed out that his girlfriend had started the whole 30 on her own.  I discussed with the client that it seems that this is an indication that she is interested not only in her diet but in her overall health.  He agreed.  I suggested that he broached the subject with her.  That made the client more anxious.  As he continued to process he stated that he wanted to take a "wait and see attitude."  I explained to the client that this would not get him where he wanted to go.  He agreed but was still uncertain about approaching his girlfriend.  We discussed using mood independent behavior and trying to be skillful in his delivery.  He Michael Powers try to do so.  His subjective units of distress at the end of the session was less than 3.  His positive cognition was, "maybe I can do this."  Interventions: Assertiveness/Communication, Motivational Interviewing, Solution-Oriented/Positive Psychology, Eye Movement Desensitization and Reprocessing (EMDR) and Insight-Oriented  Diagnosis:   ICD-10-CM   1. Adjustment disorder with mixed anxiety and depressed mood  F43.23     Plan: Mood independent behavior,  assertive communication, boundaries, self-care, positive self talk.  Michael Powers, [facility]

## 2020-01-16 DIAGNOSIS — K219 Gastro-esophageal reflux disease without esophagitis: Secondary | ICD-10-CM | POA: Diagnosis not present

## 2020-01-16 DIAGNOSIS — E860 Dehydration: Secondary | ICD-10-CM | POA: Diagnosis not present

## 2020-01-24 ENCOUNTER — Other Ambulatory Visit: Payer: Self-pay

## 2020-01-24 ENCOUNTER — Ambulatory Visit (INDEPENDENT_AMBULATORY_CARE_PROVIDER_SITE_OTHER): Payer: BC Managed Care – PPO | Admitting: Psychiatry

## 2020-01-24 ENCOUNTER — Encounter: Payer: Self-pay | Admitting: Psychiatry

## 2020-01-24 DIAGNOSIS — F4322 Adjustment disorder with anxiety: Secondary | ICD-10-CM

## 2020-01-24 NOTE — Progress Notes (Signed)
°      [facility] Counselor/Therapist Progress Note  Patient ID: Michael Powers, MRN: [id number],    Date: 01/24/2020  Time Spent: 50 minutes   Treatment Type: Individual Therapy  Reported Symptoms: anxious  Mental Status Exam:  Appearance:   Casual     Behavior:  Appropriate  Motor:  Normal  Speech/Language:   Clear and Coherent  Affect:  Appropriate  Mood:  anxious  Thought process:  normal  Thought content:    WNL  Sensory/Perceptual disturbances:    WNL  Orientation:  oriented to person, place, time/date and situation  Attention:  Good  Concentration:  Good  Memory:  WNL  Fund of knowledge:   Good  Insight:    Good  Judgment:   Good  Impulse Control:  Good   Risk Assessment: Danger to Self:  No Self-injurious Behavior: No Danger to Others: No Duty to Warn:no Physical Aggression / Violence:No  Access to Firearms a concern: No  Gang Involvement:No   Subjective: The client states that he has had some anxiety because he has been working a lot less.  "With my dad's death I have taken my foot off the accelerator."  The client states that he is playing more golf and enjoying it.  He has started engaging in activities that he used to not be able to do.  He traveled up to [place], [place] to go to the casino with 2 older friends.  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  On this trip he stated he sat in the back seat of the truck.  "It all went well." I discussed with the client what was the circumstances that occur when his anxiety goes up?Marland Kitchen  He was able to identify that in the past pressure from his dad would cause him to feel anxious.  He identified that when his daughter puts demands on him he becomes anxious.  The same thing can be said with his girlfriend and mom.  Today I used eye-movement with the client around the circumstances.  His negative cognition was, "I am powerless."  He feels anxiety in his chest.   It is a subjective units of distress of 6.  As the client processed I pointed out that he was a grown adult and could make his own decisions about things.  He agreed that he does not have to be controlled by other peoples expectations.  He was able to let go of that and his positive cognition was, "I can let go.  His subjective units of distress was a 0.  Interventions: Assertiveness/Communication, Motivational Interviewing, Solution-Oriented/Positive Psychology, [employer] Desensitization and Reprocessing (EMDR) and Insight-Oriented  Diagnosis:   ICD-10-CM   1. Adjustment disorder with anxiety  F43.22     Plan: Mood independent behavior, positive self talk, self-care, assertiveness, boundaries.  [name] Ruther Ephraim, [facility]

## 2020-02-14 ENCOUNTER — Ambulatory Visit (INDEPENDENT_AMBULATORY_CARE_PROVIDER_SITE_OTHER): Payer: BC Managed Care – PPO | Admitting: Psychiatry

## 2020-02-14 ENCOUNTER — Other Ambulatory Visit: Payer: Self-pay

## 2020-02-14 ENCOUNTER — Encounter: Payer: Self-pay | Admitting: Psychiatry

## 2020-02-14 DIAGNOSIS — F4322 Adjustment disorder with anxiety: Secondary | ICD-10-CM | POA: Diagnosis not present

## 2020-02-14 NOTE — Progress Notes (Signed)
      [facility] Counselor/Therapist Progress Note  Patient ID: Michael Powers, MRN: [id number],    Date: 02/14/2020  Time Spent: 50 minutes   Treatment Type: Individual Therapy  Reported Symptoms: anxiety  Mental Status Exam:  Appearance:   Casual     Behavior:  Appropriate  Motor:  Normal  Speech/Language:   Clear and Coherent  Affect:  Appropriate  Mood:  anxious  Thought process:  normal  Thought content:    WNL  Sensory/Perceptual disturbances:    WNL  Orientation:  oriented to person, place, time/date and situation  Attention:  Good  Concentration:  Good  Memory:  WNL  Fund of knowledge:   Good  Insight:    Good  Judgment:   Good  Impulse Control:  Good   Risk Assessment: Danger to Self:  No Self-injurious Behavior: No Danger to Others: No Duty to Warn:no Physical Aggression / Violence:No  Access to Firearms a concern: No  Gang Involvement:No   Subjective: The client has 2 main worries today.  The first one is about getting the vaccine which he has not done.  The second one is about his relationship with his girlfriend because they are not moving forward.  In terms of the vaccine the client has trust issues with the vaccine.  There is so much conflicting information on the Internet with some people saying the vaccine will kill you in 5 years etc.  I discussed with the client that the vaccine does offer some protection from the COVID-19 virus.  Yes, some people who have been vaccinated have also gotten COVID anyway.  New CDC statistics, which I provided for the client, shows that being hospitalized due to COVID-19 but fully vaccinated there is a 1 and 31,000 chance that that will happen.  Dying from COVID-19 fully vaccinated you have 1 in 138,000 chances of that happening.  This seemed to make sense to the client.  He will consider his options. The client states that he and his girlfriend continue to have this on again off again relationship.  She is not pressuring him  for anything and he is not pressuring her.  The client states that if he comes across somebody else then he Aubrey Voong break up with his current girlfriend.  He is not necessarily invested in the relationship but does not necessarily want her to find someone else.  I pointed out that that really was unfair to her.  The client agrees and will continue to think about where he wants to go in this relationship.  Interventions: Assertiveness/Communication, Motivational Interviewing, Solution-Oriented/Positive Psychology and Insight-Oriented  Diagnosis:   ICD-10-CM   1. Adjustment disorder with anxiety  F43.22     Plan: Mood independent behavior, weigh his options with the COVID-19 vaccine, consider what he wants going forward with his girlfriend.  Michael Powers, [facility]

## 2020-03-05 ENCOUNTER — Encounter: Payer: Self-pay | Admitting: Psychiatry

## 2020-03-05 ENCOUNTER — Ambulatory Visit (INDEPENDENT_AMBULATORY_CARE_PROVIDER_SITE_OTHER): Payer: BC Managed Care – PPO | Admitting: Psychiatry

## 2020-03-05 ENCOUNTER — Other Ambulatory Visit: Payer: Self-pay

## 2020-03-05 DIAGNOSIS — F4322 Adjustment disorder with anxiety: Secondary | ICD-10-CM | POA: Diagnosis not present

## 2020-03-05 NOTE — Progress Notes (Signed)
      [facility] Counselor/Therapist Progress Note  Patient ID: Michael Powers, MRN: [id number],    Date: 03/05/2020  Time Spent: 50 minutes   Treatment Type: Individual Therapy  Reported Symptoms: anxiety  Mental Status Exam:  Appearance:   Casual     Behavior:  Appropriate  Motor:  Normal  Speech/Language:   Clear and Coherent  Affect:  Appropriate  Mood:  anxious  Thought process:  normal  Thought content:    WNL  Sensory/Perceptual disturbances:    WNL  Orientation:  oriented to person, place, time/date and situation  Attention:  Good  Concentration:  Good  Memory:  WNL  Fund of knowledge:   Good  Insight:    Good  Judgment:   Good  Impulse Control:  Good   Risk Assessment: Danger to Self:  No Self-injurious Behavior: No Danger to Others: No Duty to Warn:no Physical Aggression / Violence:No  Access to Firearms a concern: No  Gang Involvement:No   Subjective: The client states that he is anxious today.  It is all around his girlfriend.  He has come to the conclusion that he has the following issues.  "We need to go our separate ways.  I do not want to be alone.  I might not find anyone else.  We are connected but I am not in love with her."  I used eye-movement with the client to help reduce his anxiety from the subjective units of distress of 5 to less than 1. I also gave the client a decision making worksheet that we went through together.  The client feels that he does not know how to make the right kind of decision.  The worksheet detailed advantages and disadvantages of the 2 different options.  The first one is, "should I married my girlfriend?"  The second one is, "to be alone".  After weighing the pros and cons and assigning values to them, "should I married my girlfriend?"  had a score of 40.  The second option is, "to be alone".  That scored -50.  The client had a clear indication of the direction that he should go.  I advised the client to re-do the  decision-making sheet again to see if he comes up with similar responses.  He is still uncertain but now has more clarity.  Interventions: Assertiveness/Communication, Motivational Interviewing, Solution-Oriented/Positive Psychology, [employer] Desensitization and Reprocessing (EMDR) and Insight-Oriented  Diagnosis:   ICD-10-CM   1. Adjustment disorder with anxiety  F43.22     Plan: Reviewed decision-making model, mood independent behavior, positive self talk, self-care, assertiveness, boundaries.  [name] Marchelle Rinella, [facility]

## 2020-03-26 ENCOUNTER — Ambulatory Visit: Payer: Self-pay | Admitting: Psychiatry

## 2020-03-26 ENCOUNTER — Other Ambulatory Visit: Payer: Self-pay

## 2020-04-18 ENCOUNTER — Ambulatory Visit: Payer: BC Managed Care – PPO | Admitting: Psychiatry

## 2020-05-14 ENCOUNTER — Ambulatory Visit (INDEPENDENT_AMBULATORY_CARE_PROVIDER_SITE_OTHER): Payer: Self-pay | Admitting: Psychiatry

## 2020-05-14 ENCOUNTER — Encounter: Payer: Self-pay | Admitting: Psychiatry

## 2020-05-14 ENCOUNTER — Other Ambulatory Visit: Payer: Self-pay

## 2020-05-14 DIAGNOSIS — F4323 Adjustment disorder with mixed anxiety and depressed mood: Secondary | ICD-10-CM
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
## 2020-05-22 ENCOUNTER — Other Ambulatory Visit: Payer: Self-pay

## 2020-05-22 ENCOUNTER — Ambulatory Visit (INDEPENDENT_AMBULATORY_CARE_PROVIDER_SITE_OTHER): Payer: Self-pay | Admitting: Psychiatry

## 2020-05-22 ENCOUNTER — Encounter: Payer: Self-pay | Admitting: Psychiatry

## 2020-05-22 DIAGNOSIS — F4323 Adjustment disorder with mixed anxiety and depressed mood: Secondary | ICD-10-CM

## 2020-05-22 NOTE — Progress Notes (Signed)
      [facility] Counselor/Therapist Progress Note  Patient ID: QUINNTEN CALVIN, MRN: [id number],    Date: 05/22/2020  Time Spent: 50 minutes   Treatment Type: Individual Therapy  Reported Symptoms: anxiety, irritability  Mental Status Exam:  Appearance:   Casual     Behavior:  Appropriate  Motor:  Normal  Speech/Language:   Clear and Coherent  Affect:  Appropriate  Mood:  anxious and irritable  Thought process:  normal  Thought content:    WNL  Sensory/Perceptual disturbances:    WNL  Orientation:  oriented to person, place, time/date and situation  Attention:  Good  Concentration:  Good  Memory:  WNL  Fund of knowledge:   Good  Insight:    Good  Judgment:   Good  Impulse Control:  Good   Risk Assessment: Danger to Self:  No Self-injurious Behavior: No Danger to Others: No Duty to Warn:no Physical Aggression / Violence:No  Access to Firearms a concern: No  Gang Involvement:No   Subjective: The client states that he recently went on the first date.  They had met on [email or web address] and hit it off fairly quickly.  The client comes in today having just received a text from this woman saying, "I cannot do it now."  The client really wants to be in a relationship.  He is trying to move past his ex-girlfriend.  He is worried about the upcoming holidays and his father not being around.  He states his mom is a mass.  She is depressed and overwhelmed.  "That affects me."  He admits that he has not been good with her, becoming very frustrated and irritated with her behavior.  I pointed out to the client that he was engaging in power struggles with his mom and he needed not to engage.  He agreed.  This has given him more insight into what his father might of had to put up with. Today we focused on the client's obsessive thoughts around dating.  His negative cognition is, "I need to find someone."  He feels anxiety in his chest.  His subjective units of distress is a 6.  As the client processed  his anxiety quickly began to calm down.  He realized that he needed to focus on his daughter and not finding a girlfriend.  "I need to give her the best Christmas I can."  As he continued to process he came up with ideas about traveling possibly to [employer] or visiting some other friends out of state.  His mom will be with his sister so his time is wide open.  His subjective units of distress at the end of the session was 1.  His positive cognition was, "I can do it. I can plan it."  Interventions: Assertiveness/Communication, Motivational Interviewing, Solution-Oriented/Positive Psychology, [employer] Desensitization and Reprocessing (EMDR) and Insight-Oriented  Diagnosis:   ICD-10-CM   1. Adjustment disorder with mixed anxiety and depressed mood  F43.23     Plan: Do not engage in power struggles with mom, mood independent behavior, positive self talk, playing Christmas travel, self-care.  Jarmel Linhardt, [facility]

## 2020-06-05 ENCOUNTER — Ambulatory Visit (INDEPENDENT_AMBULATORY_CARE_PROVIDER_SITE_OTHER): Payer: Self-pay | Admitting: Psychiatry

## 2020-06-05 ENCOUNTER — Ambulatory Visit: Payer: BC Managed Care – PPO | Admitting: Psychiatry

## 2020-06-05 ENCOUNTER — Encounter: Payer: Self-pay | Admitting: Psychiatry

## 2020-06-05 ENCOUNTER — Other Ambulatory Visit: Payer: Self-pay

## 2020-06-05 DIAGNOSIS — F4323 Adjustment disorder with mixed anxiety and depressed mood: Secondary | ICD-10-CM

## 2020-06-05 NOTE — Progress Notes (Signed)
[facility] Counselor/Therapist Progress Note  Patient ID: Michael Powers, MRN: [id number],    Date: 06/05/2020  Time Spent: 50 minutes   Treatment Type: Individual Therapy  Reported Symptoms: sad, anxious  Mental Status Exam:  Appearance:   Casual     Behavior:  Appropriate  Motor:  Normal  Speech/Language:   Clear and Coherent  Affect:  Appropriate  Mood:  anxious and sad  Thought process:  normal  Thought content:    WNL  Sensory/Perceptual disturbances:    WNL  Orientation:  oriented to person, place, time/date and situation  Attention:  Good  Concentration:  Good  Memory:  WNL  Fund of knowledge:   Good  Insight:    Good  Judgment:   Good  Impulse Control:  Good   Risk Assessment: Danger to Self:  No Self-injurious Behavior: No Danger to Others: No Duty to Warn:no Physical Aggression / Violence:No  Access to Firearms a concern: No  Gang Involvement:No   Subjective: The client states this past Friday he accidentally ran over his 89 year old dog.  His dog was blind and deaf.  The client was moving his truck when his dog most likely came out from behind a bush where the client could not see him and walked into the path of his truck.  He stated that his dog's pelvis was crushed and he was in a lot of pain.  "He kept trying to bite me and I was bleeding from my arm."  The client was able to get his mother to transport his dog to the veterinarian in [place].  His mom also called 911 because the client had been bit and the dog was hurt.  The ambulance arrived and the paramedics assessed the client's wound and suggested that he go to an urgent care.  They reported the dog bite to animal control. The veterinarian told the client that his dog would not survive surgery and probably needed to be put to sleep.  The client was already upset and wanting to spend as much time with his dog as he could.  His dog passed early Saturday morning at the veterinarian's.  The next  morning animal control came by to check on the client.  They wanted access to his dog.  The client told them that his dog had died and was at the veterinarian's.  They stated according to state law the dog would have to be autopsied for rabies.  The client balked at their request but ultimately agreed.  His dogs head would be removed and sent to [facility].  He was notified today that the cremated remains of his dog are available. As the client discussed the death of his dog he used the bilateral stimulation hand paddles.  He was able to reduce some of his sadness and subjective units of distress of 7 to less than 4.  He also stated that his ex-girlfriend had come by to help him treat his dog bite.  He states he is mad at her because she was uncaring and distant.  He agrees that the relationship has come to an end and he needs to let it go.  He is trying to put all the circumstances into perspective for himself.  He is more at peace with his dad's death and the end of his relationship.  He is glad that his dog did not have a long drawnout illness.  "I will do the best I can."  Interventions: Assertiveness/Communication, Motivational Interviewing,  Solution-Oriented/Positive Psychology, [employer] Desensitization and Reprocessing (EMDR) and Insight-Oriented  Diagnosis:   ICD-10-CM   1. Adjustment disorder with mixed anxiety and depressed mood  F43.23     Plan: Allow self to grieve, mood independent behavior, positive self talk, self-care.  Michael Powers, [facility]

## 2020-06-27 ENCOUNTER — Encounter: Payer: Self-pay | Admitting: Psychiatry

## 2020-06-27 ENCOUNTER — Ambulatory Visit (INDEPENDENT_AMBULATORY_CARE_PROVIDER_SITE_OTHER): Payer: Self-pay | Admitting: Psychiatry

## 2020-06-27 ENCOUNTER — Other Ambulatory Visit: Payer: Self-pay

## 2020-06-27 DIAGNOSIS — F4323 Adjustment disorder with mixed anxiety and depressed mood: Secondary | ICD-10-CM

## 2020-06-27 NOTE — Progress Notes (Signed)
[facility] Counselor/Therapist Progress Note  Patient ID: Michael Powers, MRN: [id number],    Date: 06/27/2020  Time Spent: 50 minutes   Treatment Type: Individual Therapy  Reported Symptoms: sadness, anxiety  Mental Status Exam:  Appearance:   Casual     Behavior:  Appropriate  Motor:  Normal  Speech/Language:   Clear and Coherent  Affect:  Appropriate  Mood:  normal  Thought process:  normal  Thought content:    Rumination  Sensory/Perceptual disturbances:    WNL  Orientation:  oriented to person, place, time/date and situation  Attention:  Good  Concentration:  Good  Memory:  WNL  Fund of knowledge:   Good  Insight:    Good  Judgment:   Good  Impulse Control:  Good   Risk Assessment: Danger to Self:  No Self-injurious Behavior: No Danger to Others: No Duty to Warn:no Physical Aggression / Violence:No  Access to Firearms a concern: No  Gang Involvement:No   Subjective: The client states that his family was sick with COVID-19 over the holidays.  This significantly stressed the client out.  He also had worked hard with his insurance which he was told was going to start January 1.  When he received his insurance information the card said February 1.  The client has a call into the insurance company to figure out what is going on. Today the client is sad and anxious over the death of his dog, his dad's death, and losing his girlfriend.  "All of this put me over the edge."  The client feels overly responsible for all of these events that have happened.  He blames himself for his dog's death.  He also blames himself for his dad's death.  He feels he is responsible for his relationship ending.  Today I used eye movement with the client focusing on these events.  His negative cognition is, "I am responsible.  He feels lost and sad in his chest.  His subjective units of distress is a 6.  As the client process he remembered his dad always complaining about him.  His dad was  always blaming him for everything that went wrong.  As the client continued to process he blamed himself for the accident with his dog.  I asked the client if this was intentional?  He said no.  I asked the client how much control he had over his dad's prostate cancer?  He said none.  I agree.  I pointed out that the client gave his dog the best life possible.  I also pointed out that his dad had said positive things about him at the end of his life.  As the client continued to process he did remember that a lot of people sent him sympathy cards for his dog.  He stated one of his clients told him that she thought he had been the best owner he could be.  He then remembered that his dad had said, "I thought you were going to turn out poorly but you have become an excellent son."  As the client continued to accept this is subject to use of distress dropped to a 3.  He then realized that in his relationship with his ex-girlfriend there were many things that he had asked her not to do that she ended up doing anyway.  The client got to a place where he realized "it was not my fault."  That he really did do the best he could.  His subjective units of distress was less than 2.  Interventions: Assertiveness/Communication, Motivational Interviewing, Solution-Oriented/Positive Psychology, [employer] Desensitization and Reprocessing (EMDR) and Insight-Oriented  Diagnosis:   ICD-10-CM   1. Adjustment disorder with mixed anxiety and depressed mood  F43.23     Plan: Mood independent behavior, positive self talk, self-care, assertiveness, boundaries, possibly talk to his PCP about restarting his Lexapro.  [name] Darnell Stimson, [facility]

## 2020-07-23 ENCOUNTER — Ambulatory Visit (INDEPENDENT_AMBULATORY_CARE_PROVIDER_SITE_OTHER): Payer: 59 | Admitting: Psychiatry

## 2020-07-23 ENCOUNTER — Encounter: Payer: Self-pay | Admitting: Psychiatry

## 2020-07-23 ENCOUNTER — Other Ambulatory Visit: Payer: Self-pay

## 2020-07-23 DIAGNOSIS — F4323 Adjustment disorder with mixed anxiety and depressed mood: Secondary | ICD-10-CM | POA: Diagnosis not present
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
## 2020-08-15 ENCOUNTER — Other Ambulatory Visit: Payer: Self-pay

## 2020-08-15 ENCOUNTER — Ambulatory Visit (INDEPENDENT_AMBULATORY_CARE_PROVIDER_SITE_OTHER): Payer: 59 | Admitting: Psychiatry

## 2020-08-15 ENCOUNTER — Encounter: Payer: Self-pay | Admitting: Psychiatry

## 2020-08-15 DIAGNOSIS — F4323 Adjustment disorder with mixed anxiety and depressed mood: Secondary | ICD-10-CM | POA: Diagnosis not present

## 2020-08-15 NOTE — Progress Notes (Signed)
      [facility] Counselor/Therapist Progress Note  Patient ID: Michael Powers, MRN: [id number],    Date: 08/15/2020  Time Spent: 50 minutes   Treatment Type: Individual Therapy  Reported Symptoms: anxiety, sad  Mental Status Exam:  Appearance:   Casual     Behavior:  Appropriate  Motor:  Normal  Speech/Language:   Clear and Coherent  Affect:  Appropriate  Mood:  anxious and sad  Thought process:  normal  Thought content:    WNL  Sensory/Perceptual disturbances:    WNL  Orientation:  oriented to person, place, time/date and situation  Attention:  Good  Concentration:  Good  Memory:  WNL  Fund of knowledge:   Good  Insight:    Good  Judgment:   Good  Impulse Control:  Good   Risk Assessment: Danger to Self:  No Self-injurious Behavior: No Danger to Others: No Duty to Warn:no Physical Aggression / Violence:No  Access to Firearms a concern: No  Gang Involvement:No   Subjective: The client states that he has been doing a lot of activities with his daughter.  Typically he is working all the time.  More recently he has been taking time to enjoy his daughter.  He also has not been dating because it has not been working out.  His daughter does not want him to date.  He keeps himself busy trying to avoid thinking about the fact that he is alone.  He still has thoughts about how his ex-girlfriend could just walk away?  Today we used eye-movement focusing on his sadness and anxiety connected to this.  His negative cognition was, "I failed."  As the client processed this he became very sad and tearful.  At one point he became unable to speak due to the intensity of his emotion.  He then stated he was thinking about the recent death of his dog who he misses a lot.  He feels like he failed his dog since he accidentally ran over him and killed him.  As we talked through it, his dog was almost 16.  He was in poor health.  He did not have a lingering death either.  The client agrees and was  able to move to the thought, "it is okay." He has more acceptance of his circumstances.  Interventions: Motivational Interviewing, Solution-Oriented/Positive Psychology, [employer] Desensitization and Reprocessing (EMDR) and Insight-Oriented  Diagnosis:   ICD-10-CM   1. Adjustment disorder with mixed anxiety and depressed mood  F43.23     Plan: Self-care, positive self talk, mood independent behavior, radical acceptance, boundaries.  [name] Chesney Klimaszewski, [facility]

## 2020-09-09 ENCOUNTER — Ambulatory Visit (INDEPENDENT_AMBULATORY_CARE_PROVIDER_SITE_OTHER): Payer: 59 | Admitting: Psychiatry

## 2020-09-09 ENCOUNTER — Encounter: Payer: Self-pay | Admitting: Psychiatry

## 2020-09-09 ENCOUNTER — Other Ambulatory Visit: Payer: Self-pay

## 2020-09-09 DIAGNOSIS — F4323 Adjustment disorder with mixed anxiety and depressed mood: Secondary | ICD-10-CM

## 2020-09-09 NOTE — Progress Notes (Signed)
      [facility] Counselor/Therapist Progress Note  Patient ID: Michael Powers, MRN: [id number],    Date: 09/09/2020  Time Spent: 30 minutes   Treatment Type: Individual Therapy  Reported Symptoms: anxiety, sadness  Mental Status Exam:  Appearance:   Casual     Behavior:  Appropriate  Motor:  Normal  Speech/Language:   Clear and Coherent  Affect:  Appropriate  Mood:  anxious and sad  Thought process:  normal  Thought content:    WNL  Sensory/Perceptual disturbances:    WNL  Orientation:  oriented to person, place, time/date and situation  Attention:  Good  Concentration:  Good  Memory:  WNL  Fund of knowledge:   Good  Insight:    Good  Judgment:   Good  Impulse Control:  Good   Risk Assessment: Danger to Self:  No Self-injurious Behavior: No Danger to Others: No Duty to Warn:no Physical Aggression / Violence:No  Access to Firearms a concern: No  Gang Involvement:No   Subjective: The client states that he is stressed out with work, dating, the death of his dog and missing his dad.  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  He thinks about his old girlfriend and the people he has lost which makes him sad.  It makes him anxious to look to the future and think he has no one.  Today I started with eye-movement with the client focusing on his anxiety and sadness.  He describes it as at a subjective units of distress of 7.  He feels it in his chest.  As the client processed he immediately began to say, "I need to turn it over to [name].  I need to stop trying to control everything."  He decided that he wanted to get a new dog.  The client also does not believe in himself and realizes that he has to trust himself.  I pointed out to the client that the people he lost in his life all believed in him and thought he could be more than he is.  The client has good emotional intelligence.  He has an affable personality.  He is also hard-working, trustworthy and  reliable.  I discussed with the client finding a place where he could meet other people his age that have the same values as he does.  He has not had any success with the dating sites.  I suggested the client visit with one of the larger local churches versus the very small one he attends now.  The client will consider that.  As we continued to process his subjective units of distress was less than 3.  His positive cognition was, "I need to give it over to [name]."  Interventions: Assertiveness/Communication, Motivational Interviewing, Solution-Oriented/Positive Psychology, [employer] Desensitization and Reprocessing (EMDR) and Insight-Oriented  Diagnosis:   ICD-10-CM   1. Adjustment disorder with mixed anxiety and depressed mood  F43.23     Plan: Radical acceptance, mood independent behavior, positive self talk, self-care, exercise, assertiveness, boundaries, attend a larger local church.  [name] Yama Nielson, [facility]

## 2020-10-09 ENCOUNTER — Encounter: Payer: Self-pay | Admitting: Psychiatry

## 2020-10-09 ENCOUNTER — Other Ambulatory Visit: Payer: Self-pay

## 2020-10-09 ENCOUNTER — Ambulatory Visit (INDEPENDENT_AMBULATORY_CARE_PROVIDER_SITE_OTHER): Payer: 59 | Admitting: Psychiatry

## 2020-10-09 DIAGNOSIS — F4323 Adjustment disorder with mixed anxiety and depressed mood: Secondary | ICD-10-CM
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
## 2020-10-22 ENCOUNTER — Ambulatory Visit: Payer: Self-pay | Admitting: Psychiatry

## 2020-11-12 ENCOUNTER — Ambulatory Visit: Payer: Self-pay | Admitting: Psychiatry

## 2022-04-08 ENCOUNTER — Other Ambulatory Visit (HOSPITAL_COMMUNITY): Payer: Self-pay | Admitting: Internal Medicine

## 2022-04-08 DIAGNOSIS — R1031 Right lower quadrant pain: Secondary | ICD-10-CM

## 2022-04-28 ENCOUNTER — Encounter (HOSPITAL_COMMUNITY): Payer: Self-pay

## 2022-04-28 ENCOUNTER — Ambulatory Visit (HOSPITAL_COMMUNITY): Admission: RE | Admit: 2022-04-28 | Payer: Commercial Managed Care - HMO | Source: Ambulatory Visit

## 2023-05-20 ENCOUNTER — Other Ambulatory Visit (HOSPITAL_COMMUNITY): Payer: Self-pay | Admitting: Internal Medicine

## 2023-05-20 DIAGNOSIS — R1031 Right lower quadrant pain: Secondary | ICD-10-CM

## 2023-05-31 ENCOUNTER — Ambulatory Visit (HOSPITAL_COMMUNITY)
Admission: RE | Admit: 2023-05-31 | Discharge: 2023-05-31 | Disposition: A | Discharge status: Home / Self Care | Payer: Commercial Managed Care - HMO | Source: Ambulatory Visit | Attending: Internal Medicine | Admitting: Internal Medicine

## 2023-05-31 DIAGNOSIS — R1031 Right lower quadrant pain: Secondary | ICD-10-CM | POA: Insufficient documentation

## 2024-02-21 ENCOUNTER — Emergency Department (HOSPITAL_BASED_OUTPATIENT_CLINIC_OR_DEPARTMENT_OTHER)
Admission: EM | Admit: 2024-02-21 | Discharge: 2024-02-21 | Disposition: A | Discharge status: Home / Self Care | Attending: Emergency Medicine | Admitting: Emergency Medicine

## 2024-02-21 ENCOUNTER — Other Ambulatory Visit: Payer: Self-pay

## 2024-02-21 DIAGNOSIS — R002 Palpitations: Secondary | ICD-10-CM | POA: Insufficient documentation

## 2024-02-21 LAB — BASIC METABOLIC PANEL WITH GFR
Anion gap: 11 (ref 5–15)
BUN: 17 mg/dL (ref 6–20)
CO2: 26 mmol/L (ref 22–32)
Calcium: 9.8 mg/dL (ref 8.9–10.3)
Chloride: 103 mmol/L (ref 98–111)
Creatinine, Ser: 0.94 mg/dL (ref 0.61–1.24)
GFR, Estimated: >60 mL/min (ref >60)
Glucose, Bld: 101 mg/dL — ABNORMAL HIGH (ref 70–99)
Potassium: 4.5 mmol/L (ref 3.5–5.1)
Sodium: 140 mmol/L (ref 135–145)

## 2024-02-21 LAB — CBC WITH DIFFERENTIAL/PLATELET
Abs Immature Granulocytes: 0.01 K/uL (ref 0.00–0.07)
Basophils Absolute: 0.1 K/uL (ref 0.0–0.1)
Basophils Relative: 1 %
Eosinophils Absolute: 0.1 K/uL (ref 0.0–0.5)
Eosinophils Relative: 2 %
HCT: 41 % (ref 39.0–52.0)
Hemoglobin: 14.1 g/dL (ref 13.0–17.0)
Immature Granulocytes: 0 %
Lymphocytes Relative: 31 %
Lymphs Abs: 1.5 K/uL (ref 0.7–4.0)
MCH: 31.5 pg (ref 26.0–34.0)
MCHC: 34.4 g/dL (ref 30.0–36.0)
MCV: 91.5 fL (ref 80.0–100.0)
Monocytes Absolute: 0.3 K/uL (ref 0.1–1.0)
Monocytes Relative: 6 %
Neutro Abs: 2.9 K/uL (ref 1.7–7.7)
Neutrophils Relative %: 60 %
Platelets: 232 K/uL (ref 150–400)
RBC: 4.48 MIL/uL (ref 4.22–5.81)
RDW: 12.1 % (ref 11.5–15.5)
WBC: 4.9 K/uL (ref 4.0–10.5)
nRBC: 0 % (ref 0.0–0.2)

## 2024-02-21 LAB — MAGNESIUM: Magnesium: 2.1 mg/dL (ref 1.7–2.4)

## 2024-02-21 MED ORDER — SODIUM CHLORIDE 0.9 % IV BOLUS
1000.0000 mL | Freq: Once | INTRAVENOUS | Status: AC
  Administered 2024-02-21: 1000 mL via INTRAVENOUS

## 2024-02-21 MED ORDER — MAGNESIUM OXIDE -MG SUPPLEMENT 400 (240 MG) MG PO TABS
800.0000 mg | ORAL_TABLET | Freq: Once | ORAL | Status: AC
  Administered 2024-02-21: 800 mg via ORAL
  Filled 2024-02-21: qty 2

## 2024-02-21 NOTE — Discharge Instructions (Signed)
 The cardiology clinic should call you.  Please return for worsening symptoms chest pain shortness of breath on exertion if you cough up blood or if you pass out.  Please let your family doctor know about your visit here today.  Mention to them your difficulty sleeping.

## 2024-02-21 NOTE — ED Notes (Signed)
 Discharge instructions and follow up care with cardiology reviewed and explained, pt verbalized understanding with no further questions on d/c.
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
## 2024-02-21 NOTE — ED Triage Notes (Signed)
 Patient reports palpitations since last night. States feels like a fluttering. Reports cardiologist appointment next month but has not been able to get in yet.

## 2024-02-23 ENCOUNTER — Encounter (INDEPENDENT_AMBULATORY_CARE_PROVIDER_SITE_OTHER): Payer: Self-pay | Admitting: *Deleted

## 2024-03-13 ENCOUNTER — Ambulatory Visit (INDEPENDENT_AMBULATORY_CARE_PROVIDER_SITE_OTHER): Admitting: Gastroenterology

## 2024-03-13 ENCOUNTER — Encounter (INDEPENDENT_AMBULATORY_CARE_PROVIDER_SITE_OTHER): Payer: Self-pay | Admitting: Gastroenterology

## 2024-03-13 VITALS — BP 113/68 | HR 54 | Temp 97.0°F | Ht 70.5 in | Wt 147.6 lb

## 2024-03-13 DIAGNOSIS — R109 Unspecified abdominal pain: Secondary | ICD-10-CM

## 2024-03-13 DIAGNOSIS — K219 Gastro-esophageal reflux disease without esophagitis: Secondary | ICD-10-CM | POA: Diagnosis not present

## 2024-03-13 DIAGNOSIS — R1310 Dysphagia, unspecified: Secondary | ICD-10-CM

## 2024-03-13 DIAGNOSIS — R1319 Other dysphagia: Secondary | ICD-10-CM | POA: Insufficient documentation

## 2024-03-13 DIAGNOSIS — R1084 Generalized abdominal pain: Secondary | ICD-10-CM | POA: Insufficient documentation

## 2024-03-13 DIAGNOSIS — R197 Diarrhea, unspecified: Secondary | ICD-10-CM | POA: Diagnosis not present

## 2024-03-13 DIAGNOSIS — Z8379 Family history of other diseases of the digestive system: Secondary | ICD-10-CM | POA: Insufficient documentation
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
## 2024-03-13 NOTE — Patient Instructions (Signed)
 It was very nice to meet you today, as dicussed with will plan for the following :  1) Start eating gluten , do blood work and stool sample after 4-6 weeks  2) upper endoscopy after 6 weeks of eating gluten

## 2024-03-16 ENCOUNTER — Telehealth (INDEPENDENT_AMBULATORY_CARE_PROVIDER_SITE_OTHER): Payer: Self-pay

## 2024-03-16 NOTE — Telephone Encounter (Signed)
 ATC pt, no answer. LVM for call back.
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
## 2024-03-30 ENCOUNTER — Encounter: Payer: Self-pay | Admitting: Cardiology

## 2024-03-30 ENCOUNTER — Ambulatory Visit: Attending: Cardiology | Admitting: Cardiology

## 2024-03-30 VITALS — BP 116/63 | HR 66 | Ht 70.5 in | Wt 151.6 lb

## 2024-03-30 DIAGNOSIS — R002 Palpitations: Secondary | ICD-10-CM

## 2024-03-30 DIAGNOSIS — R0602 Shortness of breath: Secondary | ICD-10-CM

## 2024-03-30 NOTE — Patient Instructions (Signed)
 Medication Instructions:  Your physician recommends that you continue on your current medications as directed. Please refer to the Current Medication list given to you today.  *If you need a refill on your cardiac medications before your next appointment, please call your pharmacy*  Lab Work: None.  If you have labs (blood work) drawn today and your tests are completely normal, you will receive your results only by: MyChart Message (if you have MyChart) OR A paper copy in the mail If you have any lab test that is abnormal or we need to change your treatment, we will call you to review the results.  Testing/Procedures: Your physician has requested that you have an echocardiogram. Echocardiography is a painless test that uses sound waves to create images of your heart. It provides your doctor with information about the size and shape of your heart and how well your heart's chambers and valves are working. This procedure takes approximately one hour. There are no restrictions for this procedure. Please do NOT wear cologne, perfume, aftershave, or lotions (deodorant is allowed). Please arrive 15 minutes prior to your appointment time.  Please note: We ask at that you not bring children with you during ultrasound (echo/ vascular) testing. Due to room size and safety concerns, children are not allowed in the ultrasound rooms during exams. Our front office staff cannot provide observation of children in our lobby area while testing is being conducted. An adult accompanying a patient to their appointment will only be allowed in the ultrasound room at the discretion of the ultrasound technician under special circumstances. We apologize for any inconvenience.   Follow-Up: At [facility], you and your health needs are our priority.  As part of our continuing mission to provide you with exceptional heart care, our providers are all part of one team.  This team includes your primary Cardiologist  (physician) and Advanced Practice Providers or APPs (Physician Assistants and Nurse Practitioners) who all work together to provide you with the care you need, when you need it.  Your next appointment will be dependent on the results of your testing and it will be with:     Provider:   Dr. DOROTHA Schilling, MD

## 2024-04-26 NOTE — Telephone Encounter (Signed)
 LMOVM to call back to schedule EGD/DIL any room, Dr. Cinderella, 6 weeks after eating gluten   Letter mailed.

## 2024-05-15 ENCOUNTER — Telehealth (HOSPITAL_COMMUNITY): Payer: Self-pay | Admitting: Cardiology

## 2024-05-15 NOTE — Telephone Encounter (Signed)
 Patient cancelled echocardiogram scheduled for 05/17/24. Pt states that he is feeling better and no longer needs. Order will be removed from the active echo wq. Thank you.

## 2024-05-17 ENCOUNTER — Ambulatory Visit (HOSPITAL_COMMUNITY)

## 2024-06-22 ENCOUNTER — Ambulatory Visit (HOSPITAL_COMMUNITY): Admission: RE | Admit: 2024-06-22 | Source: Ambulatory Visit

## 2024-06-22 ENCOUNTER — Encounter (HOSPITAL_COMMUNITY): Payer: Self-pay

## 2024-06-22 ENCOUNTER — Other Ambulatory Visit (HOSPITAL_COMMUNITY): Payer: Self-pay | Admitting: Internal Medicine

## 2024-06-22 DIAGNOSIS — R109 Unspecified abdominal pain: Secondary | ICD-10-CM

## 2024-06-23 ENCOUNTER — Ambulatory Visit (HOSPITAL_COMMUNITY)
Admission: RE | Admit: 2024-06-23 | Discharge: 2024-06-23 | Disposition: A | Source: Ambulatory Visit | Attending: Internal Medicine | Admitting: Internal Medicine

## 2024-06-23 DIAGNOSIS — R109 Unspecified abdominal pain: Secondary | ICD-10-CM | POA: Diagnosis present

## 2024-06-28 ENCOUNTER — Encounter (INDEPENDENT_AMBULATORY_CARE_PROVIDER_SITE_OTHER): Payer: Self-pay | Admitting: Gastroenterology
